# Patient Record
Sex: Female | Born: 1975
Health system: Southern US, Community
[De-identification: ages and names within clinical notes are randomized; demographics above are authoritative.]

## PROBLEM LIST (undated history)

## (undated) DIAGNOSIS — D219 Benign neoplasm of connective and other soft tissue, unspecified: Secondary | ICD-10-CM

## (undated) DIAGNOSIS — I10 Essential (primary) hypertension: Secondary | ICD-10-CM

## (undated) HISTORY — DX: Essential (primary) hypertension: I10

## (undated) HISTORY — DX: Benign neoplasm of connective and other soft tissue, unspecified: D21.9

## (undated) HISTORY — PX: NO PAST SURGERIES: SHX2092

---

## 1998-07-25 ENCOUNTER — Emergency Department (HOSPITAL_COMMUNITY): Admission: EM | Admit: 1998-07-25 | Discharge: 1998-07-25 | Payer: Self-pay | Admitting: Emergency Medicine

## 1998-07-25 ENCOUNTER — Encounter: Payer: Self-pay | Admitting: Emergency Medicine

## 2000-11-20 ENCOUNTER — Encounter: Payer: Self-pay | Admitting: Obstetrics

## 2000-11-20 ENCOUNTER — Inpatient Hospital Stay (HOSPITAL_COMMUNITY): Admission: AD | Admit: 2000-11-20 | Discharge: 2000-11-20 | Payer: Self-pay | Admitting: Obstetrics

## 2002-10-03 ENCOUNTER — Emergency Department (HOSPITAL_COMMUNITY): Admission: AD | Admit: 2002-10-03 | Discharge: 2002-10-03 | Payer: Self-pay | Admitting: Emergency Medicine

## 2017-09-06 ENCOUNTER — Encounter: Payer: Self-pay | Admitting: *Deleted

## 2017-09-27 ENCOUNTER — Encounter: Payer: Self-pay | Admitting: Family Medicine

## 2017-09-27 ENCOUNTER — Encounter: Payer: Self-pay | Admitting: General Practice

## 2017-09-27 NOTE — Progress Notes (Signed)
Patient no showed for appt in office today. Per Dr Kennon Rounds, patient can reschedule on her own

## 2017-09-27 NOTE — Progress Notes (Signed)
Patient did not keep appointment today. She may call to reschedule.  

## 2017-11-08 ENCOUNTER — Encounter: Payer: Self-pay | Admitting: Obstetrics & Gynecology

## 2017-11-08 ENCOUNTER — Ambulatory Visit (INDEPENDENT_AMBULATORY_CARE_PROVIDER_SITE_OTHER): Payer: Self-pay | Admitting: Obstetrics & Gynecology

## 2017-11-08 DIAGNOSIS — D259 Leiomyoma of uterus, unspecified: Secondary | ICD-10-CM | POA: Insufficient documentation

## 2017-11-08 DIAGNOSIS — I10 Essential (primary) hypertension: Secondary | ICD-10-CM

## 2017-11-08 DIAGNOSIS — N921 Excessive and frequent menstruation with irregular cycle: Secondary | ICD-10-CM

## 2017-11-08 NOTE — Patient Instructions (Signed)

## 2017-11-08 NOTE — Progress Notes (Signed)
Patient ID: Claire Phillips, female   DOB: 05-20-1975, 42 y.o.   MRN: [id number]  Chief Complaint  Patient presents with  . Gynecologic Exam  heavy periods and H/o fibroids  HPI Claire Phillips is a 42 y.o. female.  G1P1001 No LMP recorded. Heavy menses with clots and flow for up to 2 weeks with pain. Notes constipation and nausea and decreased appetite. H/O fibroid, referred from [facility]PI  Past Medical History:  Diagnosis Date  . Fibroid   . Hypertension     Past Surgical History:  Procedure Laterality Date  . NO PAST SURGERIES      History reviewed. No pertinent family history.  Social History Social History   Tobacco Use  . Smoking status: Current Every Day Smoker    Packs/day: 0.50    Types: Cigarettes  . Smokeless tobacco: Never Used  Substance Use Topics  . Alcohol use: Yes    Alcohol/week: 5.0 standard drinks    Types: 2 Cans of beer, 3 Glasses of wine per week    Comment: Occasionally  . Drug use: Never    Not on File  No current outpatient medications on file.   No current facility-administered medications for this visit.     Review of Systems Review of Systems  Constitutional: Positive for appetite change.  Respiratory: Negative.   Cardiovascular: Negative.   Gastrointestinal: Positive for abdominal distention, constipation, nausea and vomiting.  Genitourinary: Positive for frequency and menstrual problem. Negative for vaginal bleeding.  Musculoskeletal: Negative.     Blood pressure (!) 152/102, pulse 69, height 5\' 6"  (1.676 m), weight 60.6 kg.  Physical Exam Physical Exam  Constitutional: She appears well-developed. No distress.  Cardiovascular: Normal rate.  Pulmonary/Chest: Effort normal. No respiratory distress.  Abdominal: She exhibits distension and mass. There is no tenderness.  Genitourinary: Vagina normal. No vaginal discharge found.  Genitourinary Comments: Uterus firm 14 week size irregular  Vitals reviewed.   Data  Reviewed Referral notes  Assessment    Sx fibroid uterus GI symptoms  HTN  Plan    Pelvic US CBC and CMP F/u 3 weeks Take HTN med as prescribed       Emeterio Reeve 11/08/2017, 10:03 AM

## 2017-11-09 LAB — COMPREHENSIVE METABOLIC PANEL
ALT: 7 IU/L (ref 0–32)
AST: 13 IU/L (ref 0–40)
Albumin/Globulin Ratio: 1.3 (ref 1.2–2.2)
Albumin: 3.9 g/dL (ref 3.5–5.5)
Alkaline Phosphatase: 60 IU/L (ref 39–117)
BUN/Creatinine Ratio: 9 (ref 9–23)
BUN: 7 mg/dL (ref 6–24)
Bilirubin Total: 0.6 mg/dL (ref 0.0–1.2)
CO2: 22 mmol/L (ref 20–29)
Calcium: 8.7 mg/dL (ref 8.7–10.2)
Chloride: 104 mmol/L (ref 96–106)
Creatinine, Ser: 0.75 mg/dL (ref 0.57–1.00)
GFR calc Af Amer: 113 mL/min/{1.73_m2} (ref >59)
GFR calc non Af Amer: 98 mL/min/{1.73_m2} (ref >59)
Globulin, Total: 3 g/dL (ref 1.5–4.5)
Glucose: 71 mg/dL (ref 65–99)
Potassium: 3.9 mmol/L (ref 3.5–5.2)
Sodium: 135 mmol/L (ref 134–144)
Total Protein: 6.9 g/dL (ref 6.0–8.5)

## 2017-11-09 LAB — CBC
Hematocrit: 26.5 % — ABNORMAL LOW (ref 34.0–46.6)
Hemoglobin: 7.5 g/dL — ABNORMAL LOW (ref 11.1–15.9)
MCH: 18.4 pg — ABNORMAL LOW (ref 26.6–33.0)
MCHC: 28.3 g/dL — ABNORMAL LOW (ref 31.5–35.7)
MCV: 65 fL — ABNORMAL LOW (ref 79–97)
Platelets: 451 10*3/uL — ABNORMAL HIGH (ref 150–450)
RBC: 4.07 x10E6/uL (ref 3.77–5.28)
RDW: 18.9 % — ABNORMAL HIGH (ref 12.3–15.4)
WBC: 4.3 10*3/uL (ref 3.4–10.8)

## 2017-11-11 ENCOUNTER — Telehealth: Payer: Self-pay

## 2017-11-11 NOTE — Telephone Encounter (Signed)
Called pt to advise of [place] appt on 11/18/17 at 11am, needs to arrive @ 10:45.No answer & no VM.

## 2017-11-12 ENCOUNTER — Telehealth: Payer: Self-pay | Admitting: *Deleted

## 2017-11-12 ENCOUNTER — Other Ambulatory Visit: Payer: Self-pay | Admitting: Obstetrics & Gynecology

## 2017-11-12 DIAGNOSIS — N921 Excessive and frequent menstruation with irregular cycle: Secondary | ICD-10-CM

## 2017-11-12 MED ORDER — FERROUS SULFATE 325 (65 FE) MG PO TABS: 325.0000 mg | ORAL_TABLET | Freq: Two times a day (BID) | ORAL | 3 refills | Status: AC

## 2017-11-12 NOTE — Telephone Encounter (Signed)
This encounter created to fix error made in previous letter.

## 2017-11-12 NOTE — Telephone Encounter (Signed)
Called the pt again to advised her of her ultrasound appointment scheduled on 11/18/17 at 11am and pt need to arrive at 1045.  Pt did not pick up and the message "your call can not be completed as dialed" was heard after ringing for several minutes.  Will send a letter.

## 2017-11-13 ENCOUNTER — Telehealth: Payer: Self-pay | Admitting: *Deleted

## 2017-11-13 NOTE — Telephone Encounter (Signed)
Called pt to inform her that she is anemic and has been prescribed iron to take BID.  This prescription was sent to the [facility] in [place] on [address]  Pt did not answer and the message "your call cannot be completed at this time" was received after several minutes of ringing.  Will send letter.

## 2017-11-13 NOTE — Telephone Encounter (Signed)
-----   Message from Woodroe Mode, MD sent at 11/12/2017 10:44 AM EST ----- Iron supplement ordered for anemia

## 2017-11-18 ENCOUNTER — Ambulatory Visit (HOSPITAL_COMMUNITY): Admission: RE | Admit: 2017-11-18 | Payer: Self-pay | Source: Ambulatory Visit

## 2017-12-02 ENCOUNTER — Encounter: Payer: Self-pay | Admitting: Obstetrics & Gynecology

## 2017-12-02 ENCOUNTER — Ambulatory Visit (INDEPENDENT_AMBULATORY_CARE_PROVIDER_SITE_OTHER): Payer: Self-pay | Admitting: Obstetrics & Gynecology

## 2017-12-02 VITALS — BP 149/88 | HR 79 | Ht 66.0 in | Wt 133.9 lb

## 2017-12-02 DIAGNOSIS — R112 Nausea with vomiting, unspecified: Secondary | ICD-10-CM

## 2017-12-02 DIAGNOSIS — D259 Leiomyoma of uterus, unspecified: Secondary | ICD-10-CM

## 2017-12-02 MED ORDER — PROMETHAZINE HCL 25 MG PO TABS: 25.0000 mg | ORAL_TABLET | Freq: Four times a day (QID) | ORAL | 2 refills | Status: DC | PRN

## 2017-12-02 MED ORDER — MEDROXYPROGESTERONE ACETATE 10 MG PO TABS: 20.0000 mg | ORAL_TABLET | Freq: Every day | ORAL | 2 refills | Status: DC

## 2017-12-02 MED ORDER — HYDROCHLOROTHIAZIDE 25 MG PO TABS: 25.0000 mg | ORAL_TABLET | Freq: Every day | ORAL | 1 refills | Status: AC

## 2017-12-02 MED FILL — PROMETHAZINE 25 MG TABLET: 25 | 7 days supply | Qty: 30 | Fill #0

## 2017-12-02 MED FILL — HYDROCHLOROTHIAZIDE 25 MG T: 25 | 30 days supply | Qty: 30 | Fill #0

## 2017-12-02 MED FILL — MEDROXYPROGESTERONE ACETATE: 10 | 15 days supply | Qty: 30 | Fill #0

## 2017-12-02 NOTE — Progress Notes (Signed)
  Subjective:     Patient ID: Claire Phillips, female   DOB: March 01, 1975, 42 y.o.   MRN: [id number]  HPI G2P1001 Patient's last menstrual period was 11/11/2017 (approximate). She still needs her [place].  Past Medical History:  Diagnosis Date  . Fibroid   . Hypertension    Past Surgical History:  Procedure Laterality Date  . NO PAST SURGERIES     Not on File Current Outpatient Medications on File Prior to Visit  Medication Sig Dispense Refill  . ferrous sulfate 325 (65 FE) MG tablet Take 1 tablet (325 mg total) by mouth 2 (two) times daily with a meal. 60 tablet 3   No current facility-administered medications on file prior to visit.      Review of Systems  Gastrointestinal: Positive for nausea and vomiting.  Genitourinary: Positive for menstrual problem.       Objective:   Physical Exam  Constitutional: She appears well-developed. No distress.  Pulmonary/Chest: Effort normal.   Today's Vitals   12/02/17 0848 12/02/17 0849  BP: (!) 143/96 (!) 149/88  Pulse: 76 79  Weight: 133 lb 14.4 oz (60.7 kg)   Height: 5\' 6"  (1.676 m)   PainSc: 0-No pain    Body mass index is 21.61 kg/m.;    CBC    Component Value Date/Time   WBC 4.3 11/08/2017 1112   RBC 4.07 11/08/2017 1112   HGB 7.5 (L) 11/08/2017 1112   HCT 26.5 (L) 11/08/2017 1112   PLT 451 (H) 11/08/2017 1112   MCV 65 (L) 11/08/2017 1112   MCH 18.4 (L) 11/08/2017 1112   MCHC 28.3 (L) 11/08/2017 1112   RDW 18.9 (H) 11/08/2017 1112    Assessment:     Patient Active Problem List   Diagnosis Date Noted  . Nausea & vomiting 12/02/2017  . Fibroid uterus 11/08/2017  . Menometrorrhagia 11/08/2017  . Essential hypertension 11/08/2017  Anemia     Plan:     Financial application, anticipate scheduling [facility] care f/u, needs to be seen by GI for N&V RTC for Bx endometrium Provera 20 mg, Phenergan Rx  Woodroe Mode, MD 12/02/2017

## 2017-12-02 NOTE — Progress Notes (Signed)
Pt given Depo Lupron application and advised to bring the completed application with verification of income back to the office.  At that time we will verify that she has completed everything and fax the application.   Pt stated understanding with no further questions.

## 2017-12-02 NOTE — Patient Instructions (Signed)
Hysterectomy Information A hysterectomy is a surgery to remove your uterus. After surgery, you will no longer have periods. Also, you will not be able to get pregnant. Reasons for this surgery  You have bleeding that is not normal and keeps coming back.  You have lasting (chronic) lower belly (pelvic) pain.  You have a lasting infection.  The lining of your uterus grows outside your uterus.  The lining of your uterus grows in the muscle of your uterus.  Your uterus falls down into your vagina.  You have a growth in your uterus that causes problems.  You have cells that could turn into cancer (precancerous cells).  You have cancer of the uterus or cervix. Types There are 3 types of hysterectomies. Depending on the type, the surgery will:  Remove the top part of the uterus only.  Remove the uterus and the cervix.  Remove the uterus, cervix, and tissue that holds the uterus in place in the lower belly.  Ways a hysterectomy can be performed There are 5 ways this surgery can be performed.  A cut (incision) is made in the belly (abdomen). The uterus is taken out through the cut.  A cut is made in the vagina. The uterus is taken out through the cut.  Three or four cuts are made in the belly. A surgical device with a camera is put through one of the cuts. The uterus is cut into small pieces. The uterus is taken out through the cuts or the vagina.  Three or four cuts are made in the belly. A surgical device with a camera is put through one of the cuts. The uterus is taken out through the vagina.  Three or four cuts are made in the belly. A surgical device that is controlled by a computer makes a visual image. The device helps the surgeon control the surgical tools. The uterus is cut into small pieces. The pieces are taken out through the cuts or through the vagina.  What can I expect after the surgery?  You will be given pain medicine.  You will need help at home for 3-5 days  after surgery.  You will need to see your doctor in 2-4 weeks after surgery.  You may get hot flashes, have night sweats, and have trouble sleeping.  You may need to have Pap tests in the future if your surgery was related to cancer. Talk to your doctor. It is still good to have regular exams. This information is not intended to replace advice given to you by your health care provider. Make sure you discuss any questions you have with your health care provider. Document Released: 03/12/2011 Document Revised: 05/26/2015 Document Reviewed: 08/25/2012 Elsevier Interactive Patient Education  [employer].

## 2017-12-05 ENCOUNTER — Ambulatory Visit (HOSPITAL_COMMUNITY)
Admission: RE | Admit: 2017-12-05 | Discharge: 2017-12-05 | Disposition: A | Discharge status: Home / Self Care | Payer: Self-pay | Source: Ambulatory Visit | Attending: Obstetrics & Gynecology | Admitting: Obstetrics & Gynecology

## 2017-12-05 DIAGNOSIS — D259 Leiomyoma of uterus, unspecified: Secondary | ICD-10-CM | POA: Insufficient documentation

## 2017-12-09 ENCOUNTER — Telehealth: Payer: Self-pay | Admitting: *Deleted

## 2017-12-09 NOTE — Telephone Encounter (Signed)
-----   Message from Woodroe Mode, MD sent at 12/06/2017 11:45 AM EST ----- Fibroid uterus, please keep appointment

## 2017-12-09 NOTE — Telephone Encounter (Signed)
Called pt to inform her that her ultrasound showed fibroids and to reinforce the importance of keeping her follow up appointment.  Pt did not pick up and after several minutes of ringing, the message "Marya Amsler sorry but your call cannot be completed as dialed" was received.  She has a follow up appointment on 01/02/18 @ 1535.

## 2017-12-10 NOTE — Telephone Encounter (Signed)
Second attempt to call pt to notify her of her ultrasound results.  Pt did not pick up and after several minutes of ringing, the message "Marya Amsler sorry but your call cannot be completed as dialed" was received.  Will send letter.

## 2018-01-02 ENCOUNTER — Encounter: Payer: Self-pay | Admitting: Obstetrics & Gynecology

## 2018-01-02 ENCOUNTER — Emergency Department (HOSPITAL_COMMUNITY): Payer: Self-pay

## 2018-01-02 ENCOUNTER — Emergency Department (HOSPITAL_COMMUNITY)
Admission: EM | Admit: 2018-01-02 | Discharge: 2018-01-02 | Disposition: A | Discharge status: Home / Self Care | Payer: Self-pay | Attending: Emergency Medicine | Admitting: Emergency Medicine

## 2018-01-02 ENCOUNTER — Encounter (HOSPITAL_COMMUNITY): Payer: Self-pay | Admitting: Emergency Medicine

## 2018-01-02 ENCOUNTER — Other Ambulatory Visit: Payer: Self-pay

## 2018-01-02 ENCOUNTER — Ambulatory Visit (INDEPENDENT_AMBULATORY_CARE_PROVIDER_SITE_OTHER): Payer: Self-pay | Admitting: Obstetrics & Gynecology

## 2018-01-02 VITALS — BP 149/108 | HR 79 | Ht 66.0 in | Wt 128.6 lb

## 2018-01-02 DIAGNOSIS — I10 Essential (primary) hypertension: Secondary | ICD-10-CM | POA: Insufficient documentation

## 2018-01-02 DIAGNOSIS — F17210 Nicotine dependence, cigarettes, uncomplicated: Secondary | ICD-10-CM | POA: Insufficient documentation

## 2018-01-02 DIAGNOSIS — R109 Unspecified abdominal pain: Secondary | ICD-10-CM | POA: Insufficient documentation

## 2018-01-02 DIAGNOSIS — D259 Leiomyoma of uterus, unspecified: Secondary | ICD-10-CM

## 2018-01-02 DIAGNOSIS — N390 Urinary tract infection, site not specified: Secondary | ICD-10-CM | POA: Insufficient documentation

## 2018-01-02 DIAGNOSIS — Z79899 Other long term (current) drug therapy: Secondary | ICD-10-CM | POA: Insufficient documentation

## 2018-01-02 LAB — CBC
HCT: 27.6 % — ABNORMAL LOW (ref 36.0–46.0)
Hemoglobin: 8 g/dL — ABNORMAL LOW (ref 12.0–15.0)
MCH: 19 pg — AB (ref 26.0–34.0)
MCHC: 29 g/dL — ABNORMAL LOW (ref 30.0–36.0)
MCV: 65.4 fL — AB (ref 80.0–100.0)
Platelets: 418 10*3/uL — ABNORMAL HIGH (ref 150–400)
RBC: 4.22 MIL/uL (ref 3.87–5.11)
RDW: 21.8 % — ABNORMAL HIGH (ref 11.5–15.5)
WBC: 7.4 10*3/uL (ref 4.0–10.5)
nRBC: 0 % (ref 0.0–0.2)

## 2018-01-02 LAB — URINALYSIS, ROUTINE W REFLEX MICROSCOPIC
Bilirubin Urine: NEGATIVE
Glucose, UA: NEGATIVE mg/dL
Hgb urine dipstick: NEGATIVE
Ketones, ur: 80 mg/dL — AB
Leukocytes, UA: NEGATIVE
Nitrite: NEGATIVE
Protein, ur: NEGATIVE mg/dL
Specific Gravity, Urine: 1.039 — ABNORMAL HIGH (ref 1.005–1.030)
pH: 7 (ref 5.0–8.0)

## 2018-01-02 LAB — COMPREHENSIVE METABOLIC PANEL
ALBUMIN: 3.2 g/dL — AB (ref 3.5–5.0)
ALT: 14 U/L (ref 0–44)
ANION GAP: 12 (ref 5–15)
AST: 16 U/L (ref 15–41)
Alkaline Phosphatase: 62 U/L (ref 38–126)
BUN: <5 mg/dL — ABNORMAL LOW (ref 6–20)
CALCIUM: 8.8 mg/dL — AB (ref 8.9–10.3)
CO2: 20 mmol/L — AB (ref 22–32)
Chloride: 103 mmol/L (ref 98–111)
Creatinine, Ser: 0.71 mg/dL (ref 0.44–1.00)
GFR calc Af Amer: >60 mL/min (ref >60)
GFR calc non Af Amer: >60 mL/min (ref >60)
Glucose, Bld: 107 mg/dL — ABNORMAL HIGH (ref 70–99)
Potassium: 2.8 mmol/L — ABNORMAL LOW (ref 3.5–5.1)
SODIUM: 135 mmol/L (ref 135–145)
TOTAL PROTEIN: 7 g/dL (ref 6.5–8.1)
Total Bilirubin: 0.7 mg/dL (ref 0.3–1.2)

## 2018-01-02 LAB — I-STAT BETA HCG BLOOD, ED (MC, WL, AP ONLY): I-stat hCG, quantitative: 11.4 m[IU]/mL — ABNORMAL HIGH (ref <5)

## 2018-01-02 LAB — HCG, QUANTITATIVE, PREGNANCY: hCG, Beta Chain, Quant, S: <1 m[IU]/mL (ref <5)

## 2018-01-02 LAB — LIPASE, BLOOD: Lipase: 16 U/L (ref 11–51)

## 2018-01-02 MED ORDER — PROMETHAZINE HCL 25 MG PO TABS: 25.0000 mg | ORAL_TABLET | Freq: Four times a day (QID) | ORAL | 0 refills | Status: DC | PRN

## 2018-01-02 MED ORDER — CEPHALEXIN 500 MG PO CAPS: 500.0000 mg | ORAL_CAPSULE | Freq: Two times a day (BID) | ORAL | 0 refills | Status: AC

## 2018-01-02 MED ORDER — ONDANSETRON HCL 4 MG/2ML IJ SOLN
4.0000 mg | Freq: Once | INTRAMUSCULAR | Status: AC | PRN
  Administered 2018-01-02: 4 mg via INTRAVENOUS
  Filled 2018-01-02: qty 2

## 2018-01-02 MED ORDER — SODIUM CHLORIDE 0.9 % IV SOLN
1.0000 g | Freq: Once | INTRAVENOUS | Status: AC
  Administered 2018-01-02: 1 g via INTRAVENOUS
  Filled 2018-01-02: qty 10

## 2018-01-02 MED ORDER — IOHEXOL 300 MG/ML  SOLN
100.0000 mL | Freq: Once | INTRAMUSCULAR | Status: AC | PRN
  Administered 2018-01-02: 100 mL via INTRAVENOUS

## 2018-01-02 MED ORDER — MORPHINE SULFATE (PF) 4 MG/ML IV SOLN
4.0000 mg | Freq: Once | INTRAVENOUS | Status: AC
  Administered 2018-01-02: 4 mg via INTRAVENOUS
  Filled 2018-01-02: qty 1

## 2018-01-02 MED ORDER — IBUPROFEN 800 MG PO TABS: 800.0000 mg | ORAL_TABLET | Freq: Four times a day (QID) | ORAL | 0 refills | Status: AC | PRN

## 2018-01-02 MED ORDER — SODIUM CHLORIDE FLUSH 0.9 % IV SOLN
5.00 | INTRAVENOUS | Status: DC
Start: ? — End: 2018-01-02

## 2018-01-02 MED ORDER — SODIUM CHLORIDE 0.9 % IV BOLUS
1000.0000 mL | Freq: Once | INTRAVENOUS | Status: AC
  Administered 2018-01-02: 1000 mL via INTRAVENOUS

## 2018-01-02 MED ORDER — METOCLOPRAMIDE HCL 5 MG/ML IJ SOLN
10.0000 mg | Freq: Once | INTRAMUSCULAR | Status: AC
  Administered 2018-01-02: 10 mg via INTRAVENOUS
  Filled 2018-01-02: qty 2

## 2018-01-02 NOTE — Progress Notes (Signed)
Patient ID: Claire Phillips, female   DOB: 15-Feb-1975, 43 y.o.   MRN: [id number]  Chief Complaint  Patient presents with  . Follow-up    HPI Claire Phillips is a 43 y.o. female.  G1P1001 Patient's last menstrual period was 11/28/2017 (within days). She was seen in ED today and Dx with UTI possible pyelonephritis. She notes pain and N&V. Still needs Lupron and financial aid for surgery, [facility] HPI  Past Medical History:  Diagnosis Date  . Fibroid   . Hypertension     Past Surgical History:  Procedure Laterality Date  . NO PAST SURGERIES      History reviewed. No pertinent family history.  Social History Social History   Tobacco Use  . Smoking status: Current Every Day Smoker    Packs/day: 0.50    Types: Cigarettes  . Smokeless tobacco: Never Used  Substance Use Topics  . Alcohol use: Yes    Alcohol/week: 5.0 standard drinks    Types: 2 Cans of beer, 3 Glasses of wine per week    Comment: Occasionally  . Drug use: Never    No Known Allergies  Current Outpatient Medications  Medication Sig Dispense Refill  . ferrous sulfate 325 (65 FE) MG tablet Take 1 tablet (325 mg total) by mouth 2 (two) times daily with a meal. 60 tablet 3  . hydrochlorothiazide (HYDRODIURIL) 25 MG tablet Take 1 tablet (25 mg total) by mouth daily. 30 tablet 1  . cephALEXin (KEFLEX) 500 MG capsule Take 1 capsule (500 mg total) by mouth 2 (two) times daily. (Patient not taking: Reported on 01/02/2018) 14 capsule 0  . ibuprofen (ADVIL,MOTRIN) 800 MG tablet Take 1 tablet (800 mg total) by mouth every 6 (six) hours as needed for moderate pain. (Patient not taking: Reported on 01/02/2018) 20 tablet 0  . medroxyPROGESTERone (PROVERA) 10 MG tablet Take 2 tablets (20 mg total) by mouth daily. (Patient not taking: Reported on 01/02/2018) 30 tablet 2  . promethazine (PHENERGAN) 25 MG tablet Take 1 tablet (25 mg total) by mouth every 6 (six) hours as needed for nausea or vomiting. (Patient not taking: Reported on  01/02/2018) 30 tablet 0   No current facility-administered medications for this visit.     Review of Systems Review of Systems  Constitutional: Positive for fever.  Gastrointestinal: Positive for nausea and vomiting.  Genitourinary: Positive for flank pain.    Blood pressure (!) 149/108, pulse 79, height 5\' 6"  (1.676 m), weight 128 lb 9.6 oz (58.3 kg), last menstrual period 11/28/2017, unknown if currently breastfeeding.  Physical Exam Physical Exam Constitutional:      Appearance: She is ill-appearing.  Pulmonary:     Effort: Pulmonary effort is normal.  Neurological:     Mental Status: She is alert.     Data Reviewed CLINICAL DATA:  Fibroids.  EXAM: TRANSABDOMINAL AND TRANSVAGINAL ULTRASOUND OF PELVIS  TECHNIQUE: Both transabdominal and transvaginal ultrasound examinations of the pelvis were performed. Transabdominal technique was performed for global imaging of the pelvis including uterus, ovaries, adnexal regions, and pelvic cul-de-sac. It was necessary to proceed with endovaginal exam following the transabdominal exam to visualize the endometrium, uterus, ovaries.  COMPARISON:  CT of the abdomen and pelvis on 11/12/2015  FINDINGS: Uterus  Measurements: At least 12.9 x 8.1 x 10.3 centimeter = volume: 566.7 mL. No fibroids or other mass visualized. Multiple fibroids are present, obscuring portions of the uterus/endometrium. Largest fibroids measure 6.3, 5.2, and 5.2 centimeters.  Endometrium  Thickness: 9.5 millimeters, displaced by  multiple fibroids. No focal abnormality visualized.  Right ovary  Measurements: 3.4 x 2.6 x 2.2 centimeters = volume: 10.0 mL. Normal appearance/no adnexal mass.  Left ovary  Measurements: 2.5 x 1.8 x 2.0 centimeters = volume: 4.5 mL. Normal appearance/no adnexal mass.  Other findings  No abnormal free fluid.  IMPRESSION: 1. Multiple fibroids enlarging the uterus. 2. Normal appearance of the endometrial  stripe. 3. No adnexal mass.   Electronically Signed   By: Nolon Nations M.D.   On: 12/05/2017 15:14  Assessment    Fibroid uterus Tx for UTI     Plan    Financial aid application Lupron 36.46 mg IM RTC6 weeks for [facility] and schedule surgery Complete course of antibiotic       Emeterio Reeve 01/02/2018, 4:18 PM

## 2018-01-02 NOTE — Patient Instructions (Signed)
Uterine Fibroids    Uterine fibroids are lumps of tissue (tumors) in your womb (uterus). They are not cancer (are benign).  Most women with this condition do not need treatment. Sometimes fibroids can affect your ability to have children (your fertility). If that happens, you may need surgery to take out the fibroids.  Follow these instructions at home:  · Take over-the-counter and prescription medicines only as told by your doctor. Your doctor may suggest NSAIDs (such as aspirin or ibuprofen) to help with pain.  · Ask your doctor if you should:  ? Take iron pills.  ? Eat more foods that have iron in them, such as dark green, leafy vegetables.  · If directed, apply heat to your back or belly to reduce pain. Use the heat source that your doctor recommends, such as a moist heat pack or a heating pad.  ? Put a towel between your skin and the heat source.  ? Leave the heat on for 20-30 minutes.  ? Remove the heat if your skin turns bright red. This is especially important if you are unable to feel pain, heat, or cold. You may have a greater risk of getting burned.  · Pay close attention to your period (menstrual) cycles. Tell your doctor about any changes, such as:  ? A heavier blood flow than usual.  ? Needing to use more pads or tampons than normal.  ? A change in how many days your period lasts.  ? A change in symptoms that come with your period, such as cramps or back pain.  · Keep all follow-up visits as told by your doctor. This is important. Your doctor may need to watch your fibroids over time for any changes.  Contact a doctor if you:  · Have pain that does not get better with medicine or heat, such as pain or cramps in:  ? Your back.  ? The area between your hip bones (pelvic area).  ? Your belly.  · Have new bleeding between your periods.  · Have more bleeding during or between your periods.  · Feel very tired or weak.  · Feel light-headed.  Get help right away if you:  · Pass out (faint).  · Have pain in the  area between your hip bones that suddenly gets worse.  · Have bleeding that soaks a tampon or pad in 30 minutes or less.  Summary  · Uterine fibroids are lumps of tissue (tumors) in your womb (uterus). They are not cancer.  · The only treatment that most women need is taking aspirin or ibuprofen for pain.  · Contact a doctor if you have pain or cramps that do not get better with medicine.  · Make sure you know what symptoms you should get help for right away.  This information is not intended to replace advice given to you by your health care provider. Make sure you discuss any questions you have with your health care provider.  Document Released: 01/20/2010 Document Revised: 11/13/2016 Document Reviewed: 11/13/2016  Elsevier Interactive Patient Education © 2019 Elsevier Inc.

## 2018-01-02 NOTE — ED Notes (Signed)
Patient is aware we need a Urinalysis, She said that she just got medicine and started feeling better, and not ready to get up rt. Now to go to the Bathroom, give her a few mins.

## 2018-01-02 NOTE — ED Provider Notes (Signed)
[facility] EMERGENCY DEPARTMENT Provider Note   CSN: [id number] Arrival date & time: 01/02/18  0134     History   Chief Complaint Chief Complaint  Patient presents with  . Emesis  . Abdominal Pain    HPI Claire Phillips is a 43 y.o. female.  Patient presents to the emergency department for evaluation of nausea, vomiting, abdominal pain.  Patient reports that she has been experiencing     Past Medical History:  Diagnosis Date  . Fibroid   . Hypertension     Patient Active Problem List   Diagnosis Date Noted  . Nausea & vomiting 12/02/2017  . Fibroid uterus 11/08/2017  . Menometrorrhagia 11/08/2017  . Essential hypertension 11/08/2017    Past Surgical History:  Procedure Laterality Date  . NO PAST SURGERIES       OB History    Gravida  2   Para  1   Term  1   Preterm      AB      Living  1     SAB      TAB      Ectopic      Multiple      Live Births  1            Home Medications    Prior to Admission medications   Medication Sig Start Date End Date Taking? Authorizing Provider  ferrous sulfate 325 (65 FE) MG tablet Take 1 tablet (325 mg total) by mouth 2 (two) times daily with a meal. 11/12/17  Yes Woodroe Mode, MD  hydrochlorothiazide (HYDRODIURIL) 25 MG tablet Take 1 tablet (25 mg total) by mouth daily. 12/02/17  Yes Woodroe Mode, MD  cephALEXin (KEFLEX) 500 MG capsule Take 1 capsule (500 mg total) by mouth 2 (two) times daily. 01/02/18   Orpah Greek, MD  ibuprofen (ADVIL,MOTRIN) 800 MG tablet Take 1 tablet (800 mg total) by mouth every 6 (six) hours as needed for moderate pain. 01/02/18   Orpah Greek, MD  medroxyPROGESTERone (PROVERA) 10 MG tablet Take 2 tablets (20 mg total) by mouth daily. Patient not taking: Reported on 01/02/2018 12/02/17   Woodroe Mode, MD  promethazine (PHENERGAN) 25 MG tablet Take 1 tablet (25 mg total) by mouth every 6 (six) hours as needed for nausea or vomiting.  01/02/18   Orpah Greek, MD    Family History No family history on file.  Social History Social History   Tobacco Use  . Smoking status: Current Every Day Smoker    Packs/day: 0.50    Types: Cigarettes  . Smokeless tobacco: Never Used  Substance Use Topics  . Alcohol use: Yes    Alcohol/week: 5.0 standard drinks    Types: 2 Cans of beer, 3 Glasses of wine per week    Comment: Occasionally  . Drug use: Never     Allergies   Patient has no known allergies.   Review of Systems Review of Systems   Physical Exam Updated Vital Signs BP (!) 147/94   Pulse 79   Temp 99.1 F (37.3 C) (Oral)   Resp 20   Ht 5\' 6"  (1.676 m)   Wt 59 kg   LMP 11/11/2017 (Exact Date)   SpO2 97%   Breastfeeding Unknown   BMI 20.98 kg/m   Physical Exam   ED Treatments / Results  Labs (all labs ordered are listed, but only abnormal results are displayed) Labs Reviewed  COMPREHENSIVE METABOLIC PANEL -  Abnormal; Notable for the following components:      Result Value   Potassium 2.8 (*)    CO2 20 (*)    Glucose, Bld 107 (*)    BUN <5 (*)    Calcium 8.8 (*)    Albumin 3.2 (*)    All other components within normal limits  CBC - Abnormal; Notable for the following components:   Hemoglobin 8.0 (*)    HCT 27.6 (*)    MCV 65.4 (*)    MCH 19.0 (*)    MCHC 29.0 (*)    RDW 21.8 (*)    Platelets 418 (*)    All other components within normal limits  URINALYSIS, ROUTINE W REFLEX MICROSCOPIC - Abnormal; Notable for the following components:   Specific Gravity, Urine 1.039 (*)    Ketones, ur 80 (*)    All other components within normal limits  I-STAT BETA HCG BLOOD, ED ([facility], [facility], [facility] ONLY) - Abnormal; Notable for the following components:   I-stat hCG, quantitative 11.4 (*)    All other components within normal limits  URINE CULTURE  LIPASE, BLOOD  HCG, QUANTITATIVE, PREGNANCY    EKG None  Radiology Ct Abdomen Pelvis W Contrast  Result Date: 01/02/2018 CLINICAL DATA:   Abdominal pain, nausea and vomiting. EXAM: CT ABDOMEN AND PELVIS WITH CONTRAST TECHNIQUE: Multidetector CT imaging of the abdomen and pelvis was performed using the standard protocol following bolus administration of intravenous contrast. CONTRAST:  110mL OMNIPAQUE IOHEXOL 300 MG/ML  SOLN COMPARISON:  CT abdomen pelvis 11/12/2015 FINDINGS: LOWER CHEST: There is no basilar pleural or apical pericardial effusion. HEPATOBILIARY: Numerous hepatic cysts, the largest of which is located in the left hepatic lobe and measures 3.4 cm. The gallbladder is normal. PANCREAS: The pancreatic parenchymal contours are normal and there is no ductal dilatation. There is no peripancreatic fluid collection. SPLEEN: Normal. ADRENALS/URINARY TRACT: --Adrenal glands: Normal. --Kidneys: There are areas of hypoattenuation at the lower poles of both kidneys. No hydronephrosis. --Urinary bladder: Normal for degree of distention STOMACH/BOWEL: --Stomach/Duodenum: There is no hiatal hernia or other gastric abnormality. The duodenal course and caliber are normal. --Small bowel: No dilatation or inflammation. --Colon: No focal abnormality. --Appendix: Normal. VASCULAR/LYMPHATIC: Normal course and caliber of the major abdominal vessels. No abdominal or pelvic lymphadenopathy. REPRODUCTIVE: Unchanged appearance of fibroid uterus. Left ovarian cysts measure up to 2.8 cm. Unchanged appearance of right ovary. MUSCULOSKELETAL. No bony spinal canal stenosis or focal osseous abnormality. OTHER: None. IMPRESSION: 1. Areas of hypoattenuation at the lower poles of both kidneys could indicate pyelonephritis. Correlate with urinalysis results. 2. Unchanged appearance of fibroid uterus and left ovarian cysts. Electronically Signed   By: Ulyses Jarred M.D.   On: 01/02/2018 06:09    Procedures Procedures (including critical care time)  Medications Ordered in ED Medications  ondansetron (ZOFRAN) injection 4 mg (4 mg Intravenous Given 01/02/18 0326)  sodium  chloride 0.9 % bolus 1,000 mL (0 mLs Intravenous Stopped 01/02/18 0500)  iohexol (OMNIPAQUE) 300 MG/ML solution 100 mL (100 mLs Intravenous Contrast Given 01/02/18 0542)  morphine 4 MG/ML injection 4 mg (4 mg Intravenous Given 01/02/18 0604)  metoCLOPramide (REGLAN) injection 10 mg (10 mg Intravenous Given 01/02/18 0604)  cefTRIAXone (ROCEPHIN) 1 g in sodium chloride 0.9 % 100 mL IVPB (1 g Intravenous New Bag/Given 01/02/18 0714)     Initial Impression / Assessment and Plan / ED Course  I have reviewed the triage vital signs and the nursing notes.  Pertinent labs & imaging results that  were available during my care of the patient were reviewed by me and considered in my medical decision making (see chart for details).     Patient presents to the emergency department with complaints of abdominal pain with nausea and vomiting.  Patient reports that she was recently diagnosed with urinary tract infection, given Ultram for pain and started on Bactrim.  After initiating the medication she started having the pain and nausea and vomiting, has not taken any meds in a day.  Patient's work-up is unrevealing.  Lab work did not show any significant abnormality.  Urinalysis is equivocal.  CT scan concerning for possible [place].  We will therefore empirically treat.  Patient given IV Rocephin here in the ER and will switch to Keflex to see if it is more tolerable.  Urine culture has been sent.  Final Clinical Impressions(s) / ED Diagnoses   Final diagnoses:  Abdominal pain, unspecified abdominal location  Urinary tract infection without hematuria, site unspecified    ED Discharge Orders         Ordered    promethazine (PHENERGAN) 25 MG tablet  Every 6 hours PRN     01/02/18 0755    ibuprofen (ADVIL,MOTRIN) 800 MG tablet  Every 6 hours PRN     01/02/18 0755    cephALEXin (KEFLEX) 500 MG capsule  2 times daily     01/02/18 0755           Orpah Greek, MD 01/02/18 [phone]

## 2018-01-02 NOTE — ED Triage Notes (Signed)
Pt BIB [facility] for abd pains, nausea, and vomiting x1 week. EMS reports pt was just recently diagnosed with a UTI on Monday of this week and given bactim & tramadol. EMS reports pt is schedule to have a hysterectomy and thyroidectomy in the morning at this facility. EMS reports 4 episodes of emesis in the last 24 hours. EMS reports 4mg  of zofran that helped relieve the nausea and vomiting. EMS reports 18g IV in left A/C.   Pt reports she was just seen last night at [facility] and diagnosed with a UTI. Pt reports she could not take the medicine because it made her sick.Pt reports generalized abd pain.

## 2018-01-03 LAB — URINE CULTURE: CULTURE: NO GROWTH

## 2018-01-06 ENCOUNTER — Telehealth: Payer: Self-pay | Admitting: General Practice

## 2018-01-06 NOTE — Telephone Encounter (Signed)
Patient called and left message on nurse voicemail line stating she needs to talk to Dr Roselie Awkward about something from her appt last week. Patient didn't leave phone number and it sounded like she was calling from a friends phone. Attempted to call patient back through her listed number, but no answer- unable to leave message as "call could not be completed".

## 2018-02-13 ENCOUNTER — Ambulatory Visit (INDEPENDENT_AMBULATORY_CARE_PROVIDER_SITE_OTHER): Payer: Self-pay | Admitting: Obstetrics & Gynecology

## 2018-02-13 ENCOUNTER — Encounter: Payer: Self-pay | Admitting: Obstetrics & Gynecology

## 2018-02-13 DIAGNOSIS — D259 Leiomyoma of uterus, unspecified: Secondary | ICD-10-CM

## 2018-02-13 MED ORDER — MEDROXYPROGESTERONE ACETATE 10 MG PO TABS: 20.0000 mg | ORAL_TABLET | Freq: Every day | ORAL | 2 refills | Status: AC

## 2018-02-13 MED ORDER — LEUPROLIDE ACETATE (3 MONTH) 11.25 MG IM KIT: 11.2500 mg | PACK | Freq: Once | INTRAMUSCULAR | Status: AC

## 2018-02-13 NOTE — Patient Instructions (Signed)
Uterine Fibroids    Uterine fibroids are lumps of tissue (tumors) in your womb (uterus). They are not cancer (are benign).  Most women with this condition do not need treatment. Sometimes fibroids can affect your ability to have children (your fertility). If that happens, you may need surgery to take out the fibroids.  Follow these instructions at home:  · Take over-the-counter and prescription medicines only as told by your doctor. Your doctor may suggest NSAIDs (such as aspirin or ibuprofen) to help with pain.  · Ask your doctor if you should:  ? Take iron pills.  ? Eat more foods that have iron in them, such as dark green, leafy vegetables.  · If directed, apply heat to your back or belly to reduce pain. Use the heat source that your doctor recommends, such as a moist heat pack or a heating pad.  ? Put a towel between your skin and the heat source.  ? Leave the heat on for 20-30 minutes.  ? Remove the heat if your skin turns bright red. This is especially important if you are unable to feel pain, heat, or cold. You may have a greater risk of getting burned.  · Pay close attention to your period (menstrual) cycles. Tell your doctor about any changes, such as:  ? A heavier blood flow than usual.  ? Needing to use more pads or tampons than normal.  ? A change in how many days your period lasts.  ? A change in symptoms that come with your period, such as cramps or back pain.  · Keep all follow-up visits as told by your doctor. This is important. Your doctor may need to watch your fibroids over time for any changes.  Contact a doctor if you:  · Have pain that does not get better with medicine or heat, such as pain or cramps in:  ? Your back.  ? The area between your hip bones (pelvic area).  ? Your belly.  · Have new bleeding between your periods.  · Have more bleeding during or between your periods.  · Feel very tired or weak.  · Feel light-headed.  Get help right away if you:  · Pass out (faint).  · Have pain in the  area between your hip bones that suddenly gets worse.  · Have bleeding that soaks a tampon or pad in 30 minutes or less.  Summary  · Uterine fibroids are lumps of tissue (tumors) in your womb (uterus). They are not cancer.  · The only treatment that most women need is taking aspirin or ibuprofen for pain.  · Contact a doctor if you have pain or cramps that do not get better with medicine.  · Make sure you know what symptoms you should get help for right away.  This information is not intended to replace advice given to you by your health care provider. Make sure you discuss any questions you have with your health care provider.  Document Released: 01/20/2010 Document Revised: 11/13/2016 Document Reviewed: 11/13/2016  Elsevier Interactive Patient Education © 2019 Elsevier Inc.

## 2018-02-13 NOTE — Progress Notes (Signed)
Patient ID: Claire Phillips, female   DOB: 08-04-1975, 43 y.o.   MRN: [id number]  Chief Complaint  Patient presents with  . Fibroids    HPI Claire Phillips is a 43 y.o. female.  G1P1001 Patient's last menstrual period was 02/07/2018 (exact date). She returns today for f/u of uterine fibroids and menorrhagia. Her financial application is pending and the paperwork for Lupron was not completed.  HPI  Past Medical History:  Diagnosis Date  . Fibroid   . Hypertension     Past Surgical History:  Procedure Laterality Date  . NO PAST SURGERIES      No family history on file.  Social History Social History   Tobacco Use  . Smoking status: Current Every Day Smoker    Packs/day: 0.50    Types: Cigarettes  . Smokeless tobacco: Never Used  Substance Use Topics  . Alcohol use: Yes    Alcohol/week: 5.0 standard drinks    Types: 2 Cans of beer, 3 Glasses of wine per week    Comment: Occasionally  . Drug use: Never    No Known Allergies  Current Outpatient Medications  Medication Sig Dispense Refill  . acetaminophen (TYLENOL) 325 MG tablet Take 650 mg by mouth every 6 (six) hours as needed.    . ferrous sulfate 325 (65 FE) MG tablet Take 1 tablet (325 mg total) by mouth 2 (two) times daily with a meal. 60 tablet 3  . cephALEXin (KEFLEX) 500 MG capsule Take 1 capsule (500 mg total) by mouth 2 (two) times daily. (Patient not taking: Reported on 01/02/2018) 14 capsule 0  . hydrochlorothiazide (HYDRODIURIL) 25 MG tablet Take 1 tablet (25 mg total) by mouth daily. (Patient not taking: Reported on 02/13/2018) 30 tablet 1  . ibuprofen (ADVIL,MOTRIN) 800 MG tablet Take 1 tablet (800 mg total) by mouth every 6 (six) hours as needed for moderate pain. (Patient not taking: Reported on 01/02/2018) 20 tablet 0  . medroxyPROGESTERone (PROVERA) 10 MG tablet Take 2 tablets (20 mg total) by mouth daily. 30 tablet 2  . promethazine (PHENERGAN) 25 MG tablet Take 1 tablet (25 mg total) by mouth every 6  (six) hours as needed for nausea or vomiting. (Patient not taking: Reported on 01/02/2018) 30 tablet 0   Current Facility-Administered Medications  Medication Dose Route Frequency Provider Last Rate Last Dose  . leuprolide (LUPRON) injection 11.25 mg  11.25 mg Intramuscular Once Woodroe Mode, MD        Review of Systems Review of Systems  Constitutional: Negative.   Gastrointestinal: Negative.   Genitourinary: Positive for menstrual problem and vaginal bleeding (spotting). Negative for vaginal discharge.    Blood pressure (!) 150/100, pulse 72, weight 133 lb 12.8 oz (60.7 kg), last menstrual period 02/07/2018, unknown if currently breastfeeding.  Physical Exam Physical Exam Vitals signs and nursing note reviewed.  Constitutional:      Appearance: Normal appearance.  Cardiovascular:     Rate and Rhythm: Normal rate.  Pulmonary:     Effort: Pulmonary effort is normal.  Neurological:     Mental Status: She is alert.  Psychiatric:        Mood and Affect: Mood normal.        Behavior: Behavior normal.     Data Reviewed CBC    Component Value Date/Time   WBC 7.4 01/02/2018 0207   RBC 4.22 01/02/2018 0207   HGB 8.0 (L) 01/02/2018 0207   HGB 7.5 (L) 11/08/2017 1112   HCT 27.6 (L)  01/02/2018 0207   HCT 26.5 (L) 11/08/2017 1112   PLT 418 (H) 01/02/2018 0207   PLT 451 (H) 11/08/2017 1112   MCV 65.4 (L) 01/02/2018 0207   MCV 65 (L) 11/08/2017 1112   MCH 19.0 (L) 01/02/2018 0207   MCHC 29.0 (L) 01/02/2018 0207   RDW 21.8 (H) 01/02/2018 0207   RDW 18.9 (H) 11/08/2017 1112     Assessment    Patient Active Problem List   Diagnosis Date Noted  . Nausea & vomiting 12/02/2017  . Fibroid uterus 11/08/2017  . Menometrorrhagia 11/08/2017  . Essential hypertension 11/08/2017   Anemia, taking iron     Plan    Once financial application is approved, schedule hysterectomy and perform endometrial biopsy. Provera 20 mg daily was reordered. Lupron injection when available.        Emeterio Reeve 02/13/2018, 11:55 AM

## 2018-03-13 ENCOUNTER — Encounter: Payer: Self-pay | Admitting: Obstetrics & Gynecology

## 2018-03-13 ENCOUNTER — Other Ambulatory Visit: Payer: Self-pay

## 2018-03-13 ENCOUNTER — Ambulatory Visit (INDEPENDENT_AMBULATORY_CARE_PROVIDER_SITE_OTHER): Payer: Self-pay | Admitting: Obstetrics & Gynecology

## 2018-03-13 VITALS — BP 134/96 | HR 71 | Wt 140.6 lb

## 2018-03-13 DIAGNOSIS — D259 Leiomyoma of uterus, unspecified: Secondary | ICD-10-CM

## 2018-03-13 DIAGNOSIS — N921 Excessive and frequent menstruation with irregular cycle: Secondary | ICD-10-CM

## 2018-03-13 NOTE — Progress Notes (Signed)
Patient presents with  . Fibroids    HPI Claire Phillips is a 43 y.o. female.  G1P1001 Patient's last menstrual period was 02/07/2018 (exact date). She returns today for f/u of uterine fibroids and menorrhagia. Her financial application is pending and the paperwork for Lupron was rejected and needs to be resubmitted. She has daily bleeding despite Provera daily.  HPI      Past Medical History:  Diagnosis Date  . Fibroid   . Hypertension          Past Surgical History:  Procedure Laterality Date  . NO PAST SURGERIES      No family history on file.  Social History Social History        Tobacco Use  . Smoking status: Current Every Day Smoker    Packs/day: 0.50    Types: Cigarettes  . Smokeless tobacco: Never Used  Substance Use Topics  . Alcohol use: Yes    Alcohol/week: 5.0 standard drinks    Types: 2 Cans of beer, 3 Glasses of wine per week    Comment: Occasionally  . Drug use: Never    No Known Allergies        Current Outpatient Medications  Medication Sig Dispense Refill  . acetaminophen (TYLENOL) 325 MG tablet Take 650 mg by mouth every 6 (six) hours as needed.    . ferrous sulfate 325 (65 FE) MG tablet Take 1 tablet (325 mg total) by mouth 2 (two) times daily with a meal. 60 tablet 3  . cephALEXin (KEFLEX) 500 MG capsule Take 1 capsule (500 mg total) by mouth 2 (two) times daily. (Patient not taking: Reported on 01/02/2018) 14 capsule 0  . hydrochlorothiazide (HYDRODIURIL) 25 MG tablet Take 1 tablet (25 mg total) by mouth daily. (Patient not taking: Reported on 02/13/2018) 30 tablet 1  . ibuprofen (ADVIL,MOTRIN) 800 MG tablet Take 1 tablet (800 mg total) by mouth every 6 (six) hours as needed for moderate pain. (Patient not taking: Reported on 01/02/2018) 20 tablet 0  . medroxyPROGESTERone (PROVERA) 10 MG tablet Take 2 tablets (20 mg total) by mouth daily. 30 tablet 2  . promethazine (PHENERGAN) 25 MG tablet Take 1 tablet (25 mg total)  by mouth every 6 (six) hours as needed for nausea or vomiting. (Patient not taking: Reported on 01/02/2018) 30 tablet 0            Current Facility-Administered Medications  Medication Dose Route Frequency Provider Last Rate Last Dose  . leuprolide (LUPRON) injection 11.25 mg  11.25 mg Intramuscular Once Woodroe Mode, MD        Review of Systems Review of Systems  Constitutional: Negative.   Gastrointestinal: Negative.   Genitourinary: Positive for menstrual problem and vaginal bleeding (spotting). Negative for vaginal discharge.       Physical Exam Physical Exam Vitals signs and nursing note reviewed.  Constitutional:      Appearance: Normal appearance.  Cardiovascular:     Rate and Rhythm: Normal rate.  Pulmonary:     Effort: Pulmonary effort is normal.  Neurological:     Mental Status: She is alert.  Psychiatric:        Mood and Affect: Mood normal.        Behavior: Behavior normal.     Data Reviewed CBC Labs(Brief)          Component Value Date/Time   WBC 7.4 01/02/2018 0207   RBC 4.22 01/02/2018 0207   HGB 8.0 (L) 01/02/2018 0207   HGB 7.5 (  L) 11/08/2017 1112   HCT 27.6 (L) 01/02/2018 0207   HCT 26.5 (L) 11/08/2017 1112   PLT 418 (H) 01/02/2018 0207   PLT 451 (H) 11/08/2017 1112   MCV 65.4 (L) 01/02/2018 0207   MCV 65 (L) 11/08/2017 1112   MCH 19.0 (L) 01/02/2018 0207   MCHC 29.0 (L) 01/02/2018 0207   RDW 21.8 (H) 01/02/2018 0207   RDW 18.9 (H) 11/08/2017 1112       Assessment         Patient Active Problem List   Diagnosis Date Noted  . Nausea & vomiting 12/02/2017  . Fibroid uterus 11/08/2017  . Menometrorrhagia 11/08/2017  . Essential hypertension 11/08/2017   Anemia, taking iron    Plan    Once financial application is approved, schedule hysterectomy and perform endometrial biopsy. Provera 20 mg daily was reordered. Lupron injection when available. We discussed that the process has been frustrating  and I reassured her we are doing all we can for her   Woodroe Mode, MD 03/13/2018

## 2018-03-27 ENCOUNTER — Telehealth: Payer: Self-pay | Admitting: Obstetrics & Gynecology

## 2018-03-27 NOTE — Telephone Encounter (Signed)
Patient called about another appointment. She stated her financial application was denied, and wanted to know what the next step would be. Requesting a call back.

## 2018-04-01 NOTE — Telephone Encounter (Signed)
Called pt and pt informed me that she received two letters one stating one is approved and one is denied.  I explained to the pt that she should contact the number that is on the letter.  Once she finds out if she is approved or denied to please give the office a call so that she can be scheduled, Virtually or Telephone visit, with the provider to discuss options.  Pt stated understanding.

## 2018-04-08 ENCOUNTER — Encounter: Payer: Self-pay | Admitting: *Deleted

## 2018-12-27 ENCOUNTER — Other Ambulatory Visit: Payer: Self-pay

## 2018-12-27 ENCOUNTER — Encounter (HOSPITAL_COMMUNITY): Payer: Self-pay | Admitting: Emergency Medicine

## 2018-12-27 DIAGNOSIS — R1110 Vomiting, unspecified: Secondary | ICD-10-CM | POA: Insufficient documentation

## 2018-12-27 DIAGNOSIS — F17210 Nicotine dependence, cigarettes, uncomplicated: Secondary | ICD-10-CM | POA: Insufficient documentation

## 2018-12-27 DIAGNOSIS — Z79899 Other long term (current) drug therapy: Secondary | ICD-10-CM | POA: Insufficient documentation

## 2018-12-27 DIAGNOSIS — I10 Essential (primary) hypertension: Secondary | ICD-10-CM | POA: Insufficient documentation

## 2018-12-27 LAB — CBC
HCT: 29.2 % — ABNORMAL LOW (ref 36.0–46.0)
Hemoglobin: 8.7 g/dL — ABNORMAL LOW (ref 12.0–15.0)
MCH: 20 pg — ABNORMAL LOW (ref 26.0–34.0)
MCHC: 29.8 g/dL — ABNORMAL LOW (ref 30.0–36.0)
MCV: 67.3 fL — ABNORMAL LOW (ref 80.0–100.0)
Platelets: 399 10*3/uL (ref 150–400)
RBC: 4.34 MIL/uL (ref 3.87–5.11)
RDW: 20.7 % — ABNORMAL HIGH (ref 11.5–15.5)
WBC: 5.4 10*3/uL (ref 4.0–10.5)
nRBC: 0 % (ref 0.0–0.2)

## 2018-12-27 LAB — I-STAT BETA HCG BLOOD, ED (MC, WL, AP ONLY): I-stat hCG, quantitative: <5 m[IU]/mL (ref <5)

## 2018-12-27 LAB — URINALYSIS, ROUTINE W REFLEX MICROSCOPIC
Bilirubin Urine: NEGATIVE
Glucose, UA: NEGATIVE mg/dL
Hgb urine dipstick: NEGATIVE
Ketones, ur: 80 mg/dL — AB
Leukocytes,Ua: NEGATIVE
Nitrite: NEGATIVE
Protein, ur: 100 mg/dL — AB
Specific Gravity, Urine: 1.025 (ref 1.005–1.030)
pH: 9 — ABNORMAL HIGH (ref 5.0–8.0)

## 2018-12-27 MED ORDER — SODIUM CHLORIDE 0.9% FLUSH
3.0000 mL | Freq: Once | INTRAVENOUS | Status: AC
  Administered 2018-12-28: 3 mL via INTRAVENOUS

## 2018-12-27 NOTE — ED Triage Notes (Signed)
Patient complaining of abdominal and back pain. Patient states she feels nauseated and she has been throwing up. Patient states this has been going on for two days.

## 2018-12-28 ENCOUNTER — Emergency Department (HOSPITAL_COMMUNITY)
Admission: EM | Admit: 2018-12-28 | Discharge: 2018-12-28 | Disposition: A | Discharge status: Home / Self Care | Payer: Self-pay | Attending: Emergency Medicine | Admitting: Emergency Medicine

## 2018-12-28 ENCOUNTER — Emergency Department (HOSPITAL_COMMUNITY): Payer: Self-pay

## 2018-12-28 DIAGNOSIS — R1084 Generalized abdominal pain: Secondary | ICD-10-CM

## 2018-12-28 DIAGNOSIS — R1110 Vomiting, unspecified: Secondary | ICD-10-CM

## 2018-12-28 DIAGNOSIS — I10 Essential (primary) hypertension: Secondary | ICD-10-CM

## 2018-12-28 LAB — COMPREHENSIVE METABOLIC PANEL
ALT: 15 U/L (ref 0–44)
AST: 17 U/L (ref 15–41)
Albumin: 4.2 g/dL (ref 3.5–5.0)
Alkaline Phosphatase: 68 U/L (ref 38–126)
Anion gap: 14 (ref 5–15)
BUN: 8 mg/dL (ref 6–20)
CO2: 22 mmol/L (ref 22–32)
Calcium: 9.2 mg/dL (ref 8.9–10.3)
Chloride: 96 mmol/L — ABNORMAL LOW (ref 98–111)
Creatinine, Ser: 0.6 mg/dL (ref 0.44–1.00)
GFR calc Af Amer: >60 mL/min (ref >60)
GFR calc non Af Amer: >60 mL/min (ref >60)
Glucose, Bld: 107 mg/dL — ABNORMAL HIGH (ref 70–99)
Potassium: 3 mmol/L — ABNORMAL LOW (ref 3.5–5.1)
Sodium: 132 mmol/L — ABNORMAL LOW (ref 135–145)
Total Bilirubin: 1.2 mg/dL (ref 0.3–1.2)
Total Protein: 7.9 g/dL (ref 6.5–8.1)

## 2018-12-28 LAB — LIPASE, BLOOD: Lipase: 26 U/L (ref 11–51)

## 2018-12-28 MED ORDER — PROMETHAZINE HCL 25 MG RE SUPP: 25.0000 mg | Freq: Four times a day (QID) | RECTAL | 0 refills | Status: AC | PRN

## 2018-12-28 MED ORDER — KETOROLAC TROMETHAMINE 30 MG/ML IJ SOLN
30.0000 mg | Freq: Once | INTRAMUSCULAR | Status: AC
  Administered 2018-12-28: 30 mg via INTRAVENOUS
  Filled 2018-12-28: qty 1

## 2018-12-28 MED ORDER — FAMOTIDINE IN NACL 20-0.9 MG/50ML-% IV SOLN
20.0000 mg | Freq: Once | INTRAVENOUS | Status: AC
  Administered 2018-12-28: 20 mg via INTRAVENOUS
  Filled 2018-12-28: qty 50

## 2018-12-28 MED ORDER — ONDANSETRON HCL 4 MG/2ML IJ SOLN
4.0000 mg | Freq: Once | INTRAMUSCULAR | Status: AC
  Administered 2018-12-28: 4 mg via INTRAVENOUS
  Filled 2018-12-28: qty 2

## 2018-12-28 MED ORDER — POTASSIUM CHLORIDE CRYS ER 20 MEQ PO TBCR
40.0000 meq | EXTENDED_RELEASE_TABLET | Freq: Once | ORAL | Status: AC
  Administered 2018-12-28: 40 meq via ORAL
  Filled 2018-12-28: qty 2

## 2018-12-28 MED ORDER — HYDROCODONE-ACETAMINOPHEN 5-325 MG PO TABS
1.0000 | ORAL_TABLET | Freq: Once | ORAL | Status: AC | PRN
  Administered 2018-12-28: 1 via ORAL
  Filled 2018-12-28: qty 1

## 2018-12-28 MED ORDER — PROMETHAZINE HCL 25 MG/ML IJ SOLN
12.5000 mg | INTRAMUSCULAR | Status: AC
  Administered 2018-12-28: 12.5 mg via INTRAVENOUS
  Filled 2018-12-28: qty 1

## 2018-12-28 MED ORDER — SODIUM CHLORIDE 0.9 % IV BOLUS
2000.0000 mL | Freq: Once | INTRAVENOUS | Status: AC
  Administered 2018-12-28: 2000 mL via INTRAVENOUS

## 2018-12-28 MED ORDER — POTASSIUM CHLORIDE 10 MEQ/100ML IV SOLN
10.0000 meq | Freq: Once | INTRAVENOUS | Status: AC
  Administered 2018-12-28: 10 meq via INTRAVENOUS
  Filled 2018-12-28: qty 100

## 2018-12-28 NOTE — ED Provider Notes (Signed)
[facility] DEPT Provider Note   CSN: [id number] Arrival date & time: 12/27/18  2147     History Chief Complaint  Patient presents with  . Abdominal Pain    Claire Phillips is a 43 y.o. female.   43 year old female with a history of fibroids and hypertension presents to the emergency department complaining of abdominal pain.  She began experiencing abdominal pain yesterday with associated back pain.  This has remained constant.  Patient complaining of associated nausea with too numerous to count episodes of emesis.  She has a history of constipation.  Denies sick contacts, hematemesis, hematuria, melena, hematochezia, diarrhea.  Was seen at OSH x 4 in November for abdominal pain and difficulty tolerating PO; presumed associated with her multiple large uterine fibroids based on work ups.  No hx of abdominal surgeries.  The history is provided by the patient. No language interpreter was used.  Abdominal Pain      Past Medical History:  Diagnosis Date  . Fibroid   . Hypertension     Patient Active Problem List   Diagnosis Date Noted  . Nausea & vomiting 12/02/2017  . Fibroid uterus 11/08/2017  . Menometrorrhagia 11/08/2017  . Essential hypertension 11/08/2017    Past Surgical History:  Procedure Laterality Date  . NO PAST SURGERIES       OB History    Gravida  1   Para  1   Term  1   Preterm      AB      Living  1     SAB      TAB      Ectopic      Multiple      Live Births  1           History reviewed. No pertinent family history.  Social History   Tobacco Use  . Smoking status: Current Every Day Smoker    Packs/day: 0.50    Types: Cigarettes  . Smokeless tobacco: Never Used  Substance Use Topics  . Alcohol use: Yes    Alcohol/week: 5.0 standard drinks    Types: 3 Glasses of wine, 2 Cans of beer per week    Comment: Occasionally  . Drug use: Never    Home Medications Prior to Admission  medications   Medication Sig Start Date End Date Taking? Authorizing Provider  acetaminophen (TYLENOL) 500 MG tablet Take 1,000 mg by mouth every 6 (six) hours as needed for moderate pain.   Yes [provider]  ibuprofen (ADVIL) 200 MG tablet Take 800 mg by mouth every 6 (six) hours as needed for moderate pain.   Yes [provider]  polyethylene glycol (MIRALAX) 17 g packet Take 17 g by mouth daily as needed for moderate constipation.   Yes [provider]  cephALEXin (KEFLEX) 500 MG capsule Take 1 capsule (500 mg total) by mouth 2 (two) times daily. Patient not taking: Reported on 01/02/2018 01/02/18   Orpah Greek, MD  ferrous sulfate 325 (65 FE) MG tablet Take 1 tablet (325 mg total) by mouth 2 (two) times daily with a meal. Patient not taking: Reported on 12/28/2018 11/12/17   Woodroe Mode, MD  hydrochlorothiazide (HYDRODIURIL) 25 MG tablet Take 1 tablet (25 mg total) by mouth daily. Patient not taking: Reported on 02/13/2018 12/02/17   Woodroe Mode, MD  ibuprofen (ADVIL,MOTRIN) 800 MG tablet Take 1 tablet (800 mg total) by mouth every 6 (six) hours as needed for moderate  pain. Patient not taking: Reported on 01/02/2018 01/02/18   Orpah Greek, MD  medroxyPROGESTERone (PROVERA) 10 MG tablet Take 2 tablets (20 mg total) by mouth daily. Patient not taking: Reported on 12/28/2018 02/13/18   Woodroe Mode, MD  promethazine (PHENERGAN) 25 MG suppository Place 1 suppository (25 mg total) rectally every 6 (six) hours as needed for nausea or vomiting. 12/28/18   Antonietta Breach, PA-C    Allergies    Aspirin  Review of Systems   Review of Systems  Gastrointestinal: Positive for abdominal pain.  Ten systems reviewed and are negative for acute change, except as noted in the HPI.    Physical Exam Updated Vital Signs BP (!) 165/105   Pulse 92   Temp 98.6 F (37 C) (Oral)   Resp 18   Ht 5\' 6"  (1.676 m)   Wt 59 kg   LMP 12/16/2018   SpO2 100%    BMI 20.98 kg/m   Physical Exam Vitals and nursing note reviewed.  Constitutional:      General: She is not in acute distress.    Appearance: She is well-developed. She is not diaphoretic.     Comments: Nontoxic appearing, slightly agitated. In NAD.  HENT:     Head: Normocephalic and atraumatic.  Eyes:     General: No scleral icterus.    Conjunctiva/sclera: Conjunctivae normal.  Cardiovascular:     Rate and Rhythm: Normal rate and regular rhythm.     Pulses: Normal pulses.  Pulmonary:     Effort: Pulmonary effort is normal. No respiratory distress.     Comments: Respirations even and unlabored Abdominal:     Palpations: Abdomen is soft.     Comments: Mild abdominal distension. Abdomen is soft. No peritoneal signs.  Musculoskeletal:        General: Normal range of motion.     Cervical back: Normal range of motion.  Skin:    General: Skin is warm and dry.     Coloration: Skin is not pale.     Findings: No erythema or rash.  Neurological:     Mental Status: She is alert and oriented to person, place, and time.     Coordination: Coordination normal.  Psychiatric:        Behavior: Behavior normal.     ED Results / Procedures / Treatments   Labs (all labs ordered are listed, but only abnormal results are displayed) Labs Reviewed  COMPREHENSIVE METABOLIC PANEL - Abnormal; Notable for the following components:      Result Value   Sodium 132 (*)    Potassium 3.0 (*)    Chloride 96 (*)    Glucose, Bld 107 (*)    All other components within normal limits  CBC - Abnormal; Notable for the following components:   Hemoglobin 8.7 (*)    HCT 29.2 (*)    MCV 67.3 (*)    MCH 20.0 (*)    MCHC 29.8 (*)    RDW 20.7 (*)    All other components within normal limits  URINALYSIS, ROUTINE W REFLEX MICROSCOPIC - Abnormal; Notable for the following components:   APPearance HAZY (*)    pH 9.0 (*)    Ketones, ur 80 (*)    Protein, ur 100 (*)    Bacteria, UA RARE (*)    All other  components within normal limits  LIPASE, BLOOD  I-STAT BETA HCG BLOOD, ED ([facility], [facility], [facility] ONLY)    EKG None  Radiology DG Abd 2 Views  Result Date: 12/28/2018 CLINICAL DATA:  Patient complaining of abdominal and back pain, nausea, emesis for 2 days EXAM: ABDOMEN - 2 VIEW COMPARISON:  CT abdomen pelvis 11/19/2018, abdominal radiograph 12/31/2017 FINDINGS: The bowel gas pattern is normal. There is no evidence of free air. No radio-opaque calculi over the gallbladder fossa or urinary tract. Few phleboliths in the pelvis. No other significant radiographic abnormality is seen. IMPRESSION: Normal bowel gas pattern. No evidence of free air or bowel obstruction. Electronically Signed   By: Lovena Le M.D.   On: 12/28/2018 03:07    Procedures Procedures (including critical care time)  Medications Ordered in ED Medications  sodium chloride flush (NS) 0.9 % injection 3 mL (3 mLs Intravenous Given 12/28/18 0317)  sodium chloride 0.9 % bolus 2,000 mL (0 mLs Intravenous Stopped 12/28/18 0516)  potassium chloride 10 mEq in 100 mL IVPB (0 mEq Intravenous Stopped 12/28/18 0423)  famotidine (PEPCID) IVPB 20 mg premix (0 mg Intravenous Stopped 12/28/18 0459)  ketorolac (TORADOL) 30 MG/ML injection 30 mg (30 mg Intravenous Given 12/28/18 0315)  ondansetron (ZOFRAN) injection 4 mg (4 mg Intravenous Given 12/28/18 0315)  HYDROcodone-acetaminophen (NORCO/VICODIN) 5-325 MG per tablet 1 tablet (1 tablet Oral Given 12/28/18 0412)  potassium chloride SA (KLOR-CON) CR tablet 40 mEq (40 mEq Oral Given 12/28/18 0514)  promethazine (PHENERGAN) injection 12.5 mg (12.5 mg Intravenous Given 12/28/18 0515)    ED Course  I have reviewed the triage vital signs and the nursing notes.  Pertinent labs & imaging results that were available during my care of the patient were reviewed by me and considered in my medical decision making (see chart for details).    MDM Rules/Calculators/A&P                       58:67  AM 43 year old female presents to the emergency department for generalized abdominal pain with persistent nausea and vomiting, difficulty tolerating PO.  Chart reviewed and she was seen at OSH x4 November for similar complaints.  During these evaluations, patient underwent pelvic ultrasound x2, abdominal CT scan, abdominal x-ray.  Findings significant only for large fibroid uterus.  It does appear that she followed up with OB/GYN on 11/28/2018.  Surgery was recommended.  Work-up today with stable anemia.  No leukocytosis.  She has mild hypokalemia, but preserved liver and kidney function.  No evidence of UTI, though UA suggests dehydration.  Will hydrate with IV fluids and treat symptomatically.  Presently do not see indication for further emergent imaging.  Will monitor.  3:50 AM Norco ordered for pain as patient reporting that Toradol "doesn't work" for her.   5:00 AM Patient tolerating PO. Reports some hiccups, but without frank nausea. States that she often vomits when her hiccups are present. Will given phenergan. Discussed results of Xray which are reassuring and without concern for obstruction, perforation. With stable labs and chronicity of symptoms for 2+ months, feel patient can follow up further with her OBGYN as an outpatient.  5:54 AM IV potassium and Pepcid finished along with IVF. Will discharge. Return precautions provided. Patient discharged in stable condition with no unaddressed concerns.   Final Clinical Impression(s) / ED Diagnoses Final diagnoses:  Vomiting  Generalized abdominal pain  Hypertension not at goal    Rx / DC Orders ED Discharge Orders         Ordered    promethazine (PHENERGAN) 25 MG suppository  Every 6 hours PRN     12/28/18 0539  Antonietta Breach, PA-C [id number]    Delora Fuel, MD [id number] [phone]

## 2018-12-28 NOTE — Discharge Instructions (Addendum)
Use Phenergan suppositories if unable to tolerate medications by mouth due to persistent nausea or vomiting.  We recommend follow-up with your OB/GYN to discuss plans for continued pain management until you are able to have your surgery scheduled and completed.  Follow-up with your primary care doctor in the interim.  Return to the ED for new or concerning symptoms.

## 2019-01-05 ENCOUNTER — Ambulatory Visit: Payer: Self-pay | Admitting: Obstetrics & Gynecology

## 2019-01-05 ENCOUNTER — Encounter: Payer: Self-pay | Admitting: Obstetrics & Gynecology

## 2020-05-01 IMAGING — US US PELVIS COMPLETE TRANSABD/TRANSVAG
1 series · 15 of 25 positions shown · non-contrast
Comparison: CT of the abdomen and pelvis on 11/12/2015

CLINICAL DATA: Fibroids.

EXAM:
TRANSABDOMINAL AND TRANSVAGINAL ULTRASOUND OF PELVIS
TECHNIQUE: Both transabdominal and transvaginal ultrasound examinations of the
pelvis were performed. Transabdominal technique was performed for
global imaging of the pelvis including uterus, ovaries, adnexal
regions, and pelvic cul-de-sac. It was necessary to proceed with
endovaginal exam following the transabdominal exam to visualize the
endometrium, uterus, ovaries..

[Series 1: us pelvis complete transabd/transvag · 57 acquisitions, 15 frames shown]
[im 1/57]
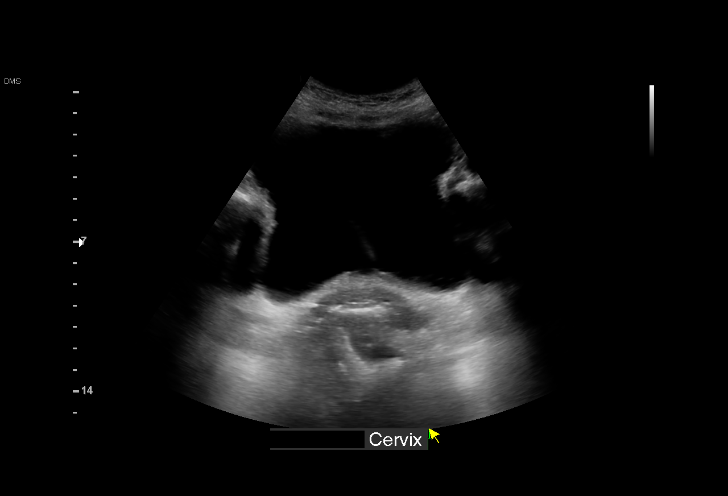
[im 5/57]
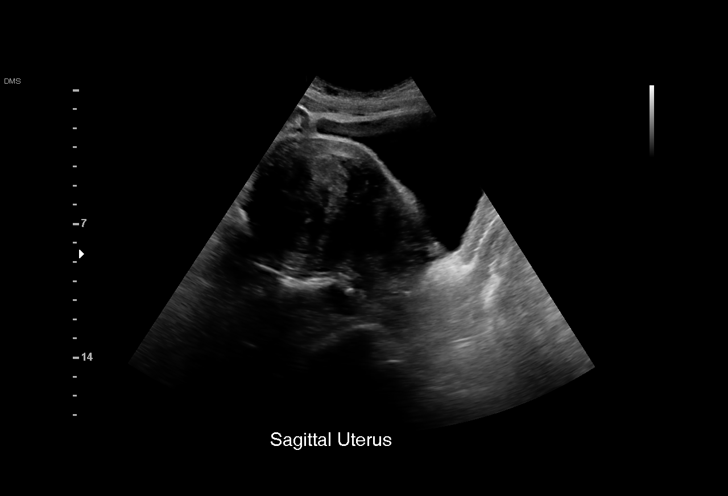
[im 10/57]
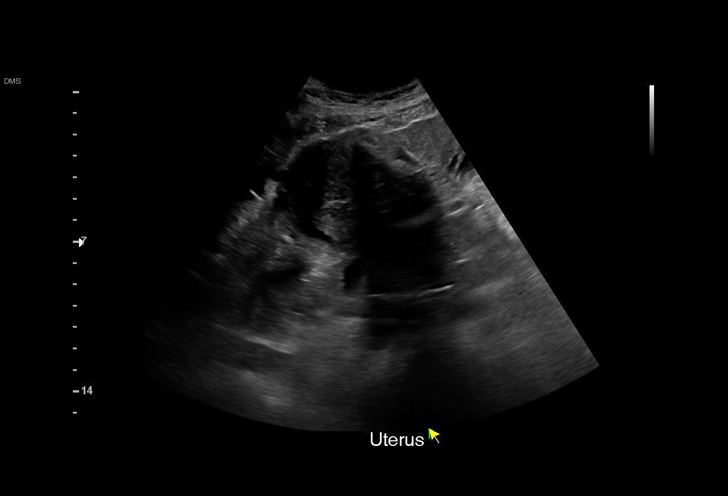
[im 12/57]
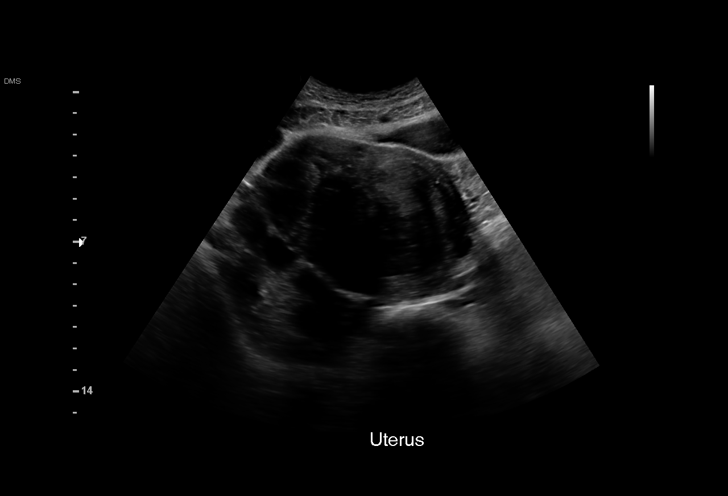
[im 17/57]
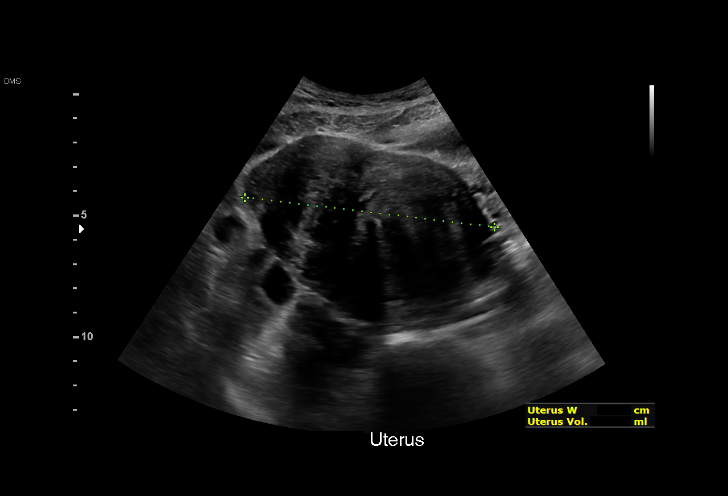
[im 22/57]
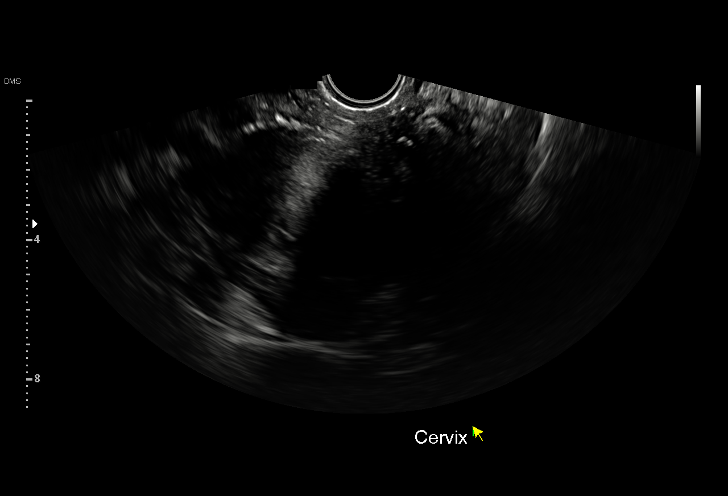
[im 24/57]
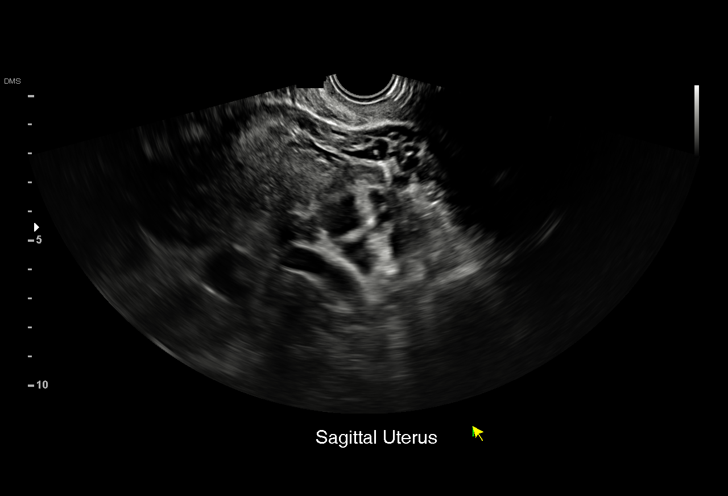
[im 29/57]
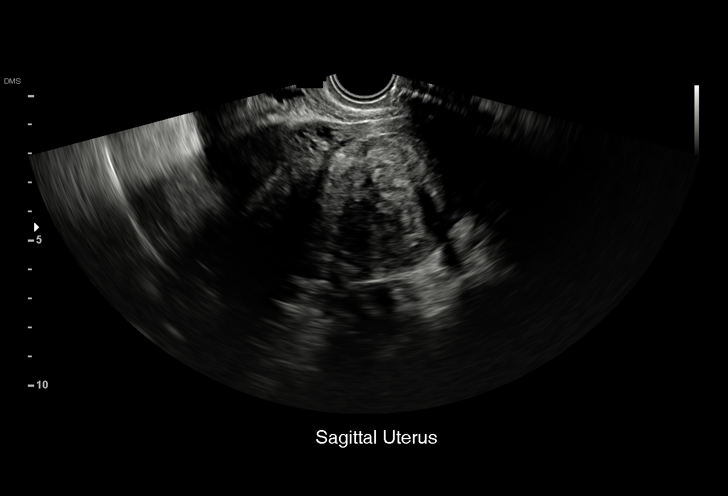
[im 33/57]
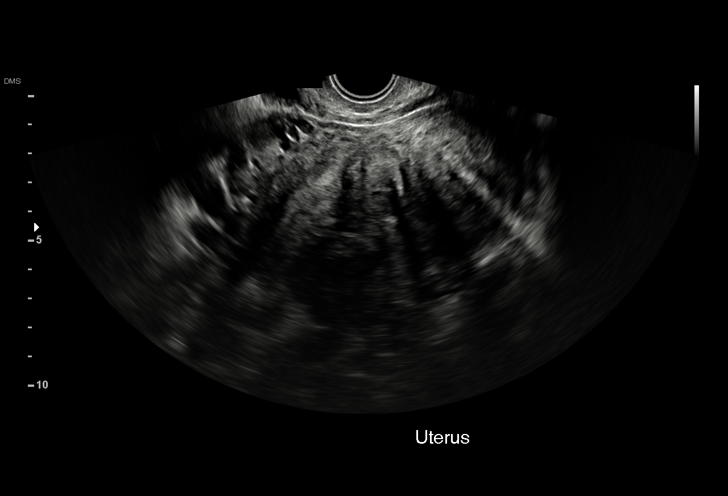
[im 36/57]
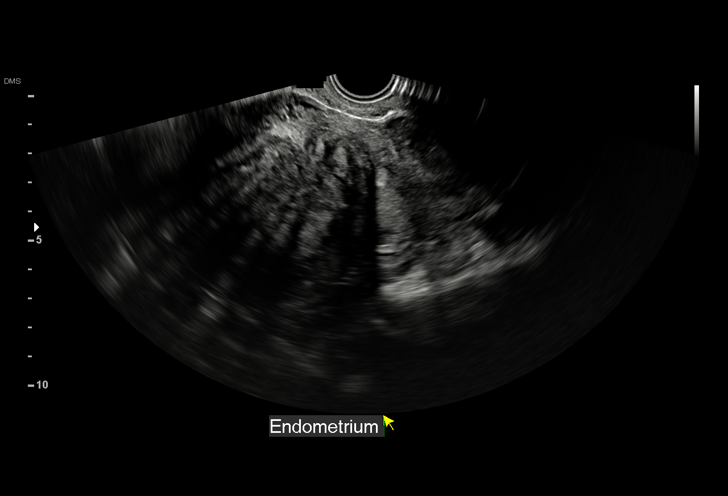
[im 40/57]
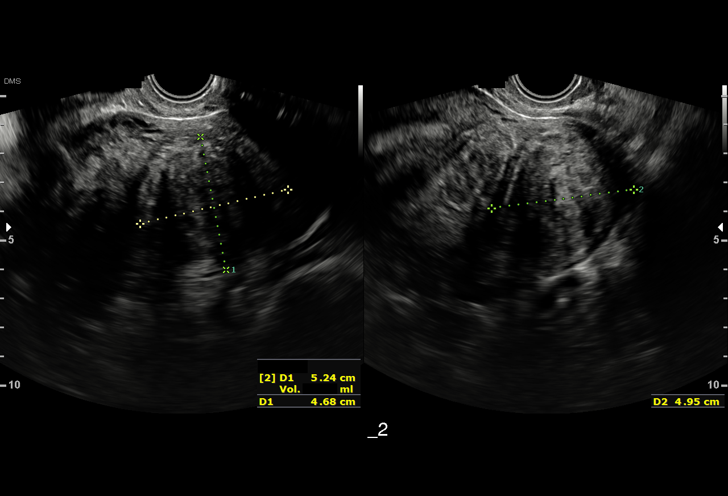
[im 45/57]
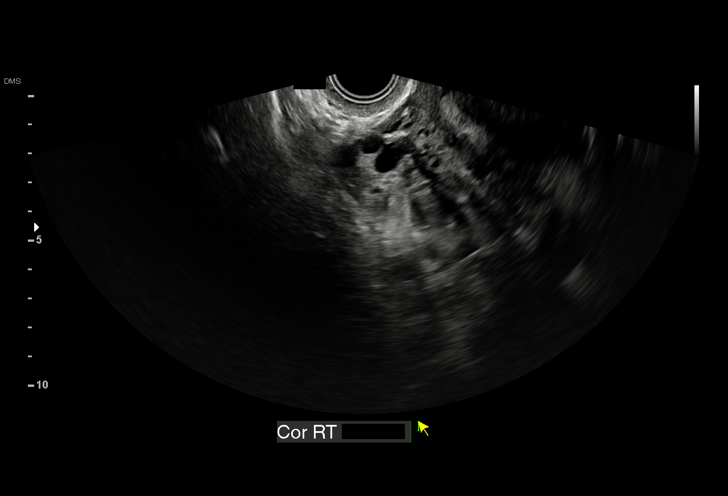
[im 47/57]
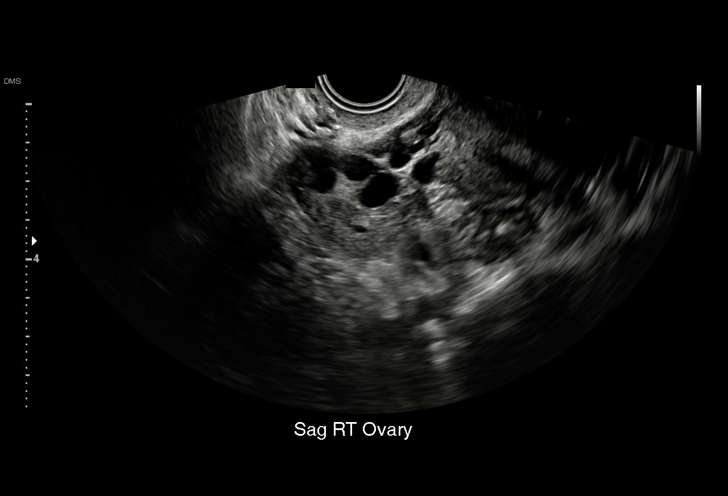
[im 52/57]
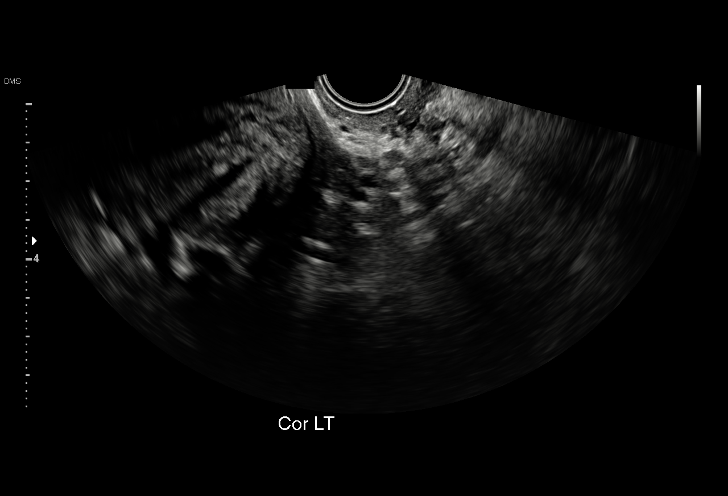
[im 57/57]
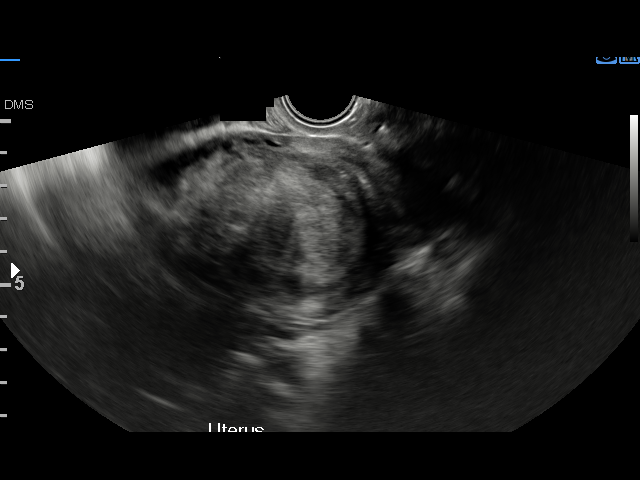

[15 of 25 positions shown; findings below may reference images not displayed]

FINDINGS: Uterus

Measurements: At least 12.9 x 8.1 x 10.3 centimeter = volume:
mL. No fibroids or other mass visualized. Multiple fibroids are
present, obscuring portions of the uterus/endometrium. Largest
fibroids measure 6.3, 5.2, and 5.2 centimeters.

Endometrium

Thickness: 9.5 millimeters, displaced by multiple fibroids.. No
focal abnormality visualized.

Right ovary

Measurements: 3.4 x 2.6 x 2.2 centimeters = volume: 10.0 mL. Normal
appearance/no adnexal mass.

Left ovary

Measurements: 2.5 x 1.8 x 2.0 centimeters = volume: 4.5 mL. Normal
appearance/no adnexal mass.

Other findings

No abnormal free fluid.
IMPRESSION: 1. Multiple fibroids enlarging the uterus.
2. Normal appearance of the endometrial stripe.
3. No adnexal mass.

## 2020-05-29 IMAGING — CT CT ABD-PELV W/ CM
2 of 5 series · 15 of 46 positions shown, 17 images · IV contrast (Omni 300)
Comparison: CT abdomen pelvis 11/12/2015

CLINICAL DATA: Abdominal pain, nausea and vomiting.

EXAM:
CT ABDOMEN AND PELVIS WITH CONTRAST
TECHNIQUE: Multidetector CT imaging of the abdomen and pelvis was performed
using the standard protocol following bolus administration of
intravenous contrast.
CONTRAST:  100mL OMNIPAQUE IOHEXOL 300 MG/ML  SOLN

[Series 3: a/p w/ 5mm · axial · 0.58mm/px · z∈[+766,+1166]mm · 12 of 89 slices shown, 14 images]
[im 5/89  soft-tissue]
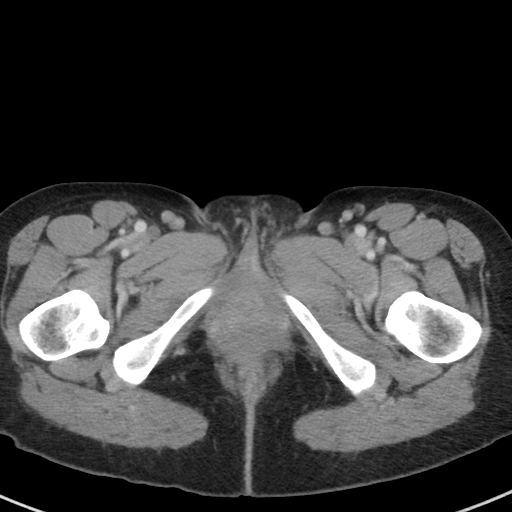
[im 5/89  bone]
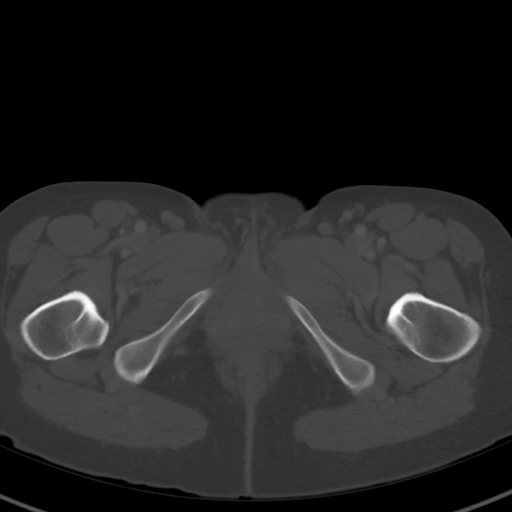
[im 13/89  soft-tissue]
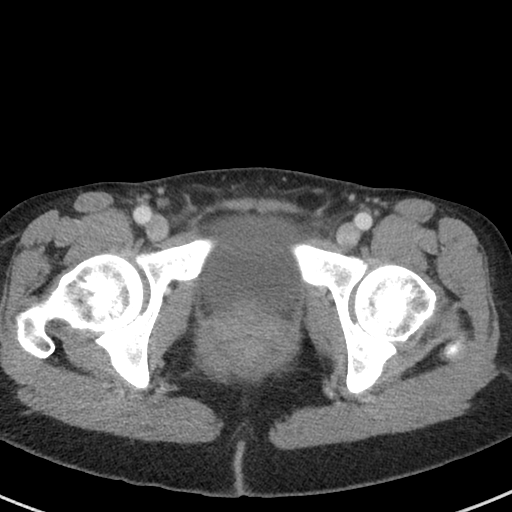
[im 21/89  soft-tissue]
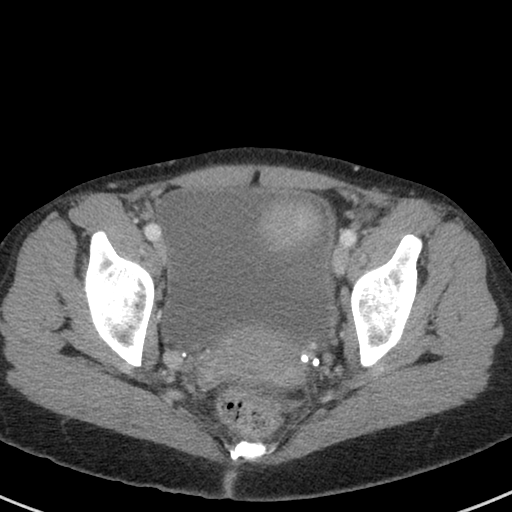
[im 29/89  soft-tissue]
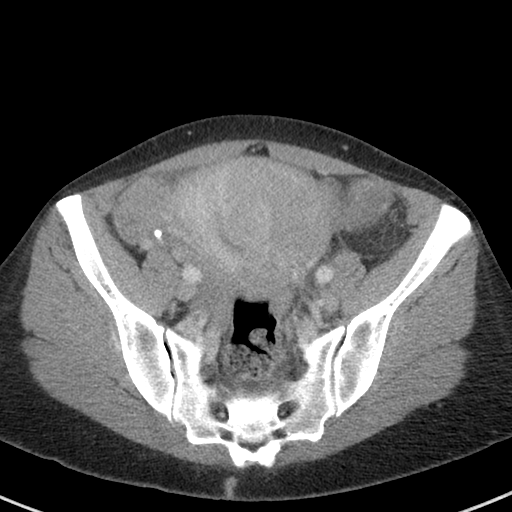
[im 33/89  soft-tissue]
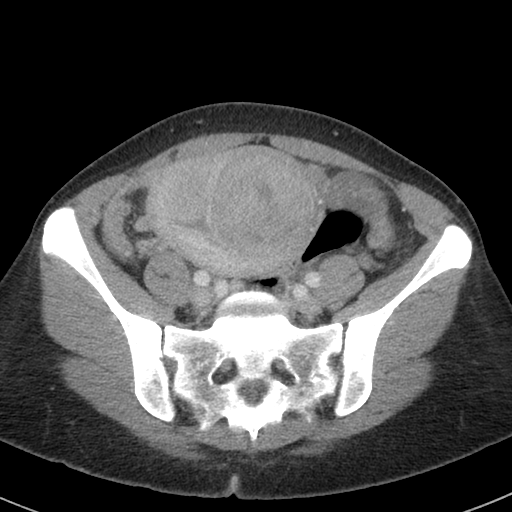
[im 41/89  soft-tissue]
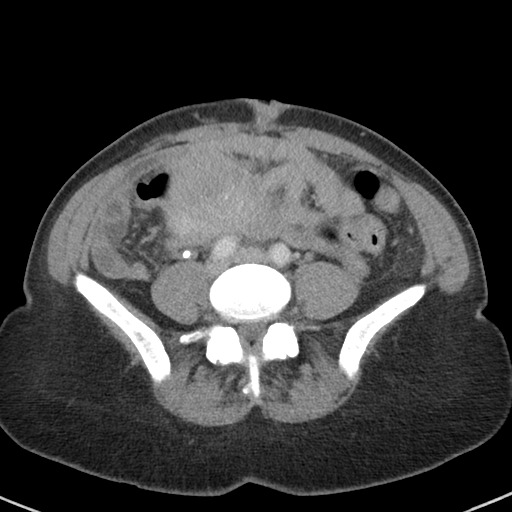
[im 49/89  soft-tissue]
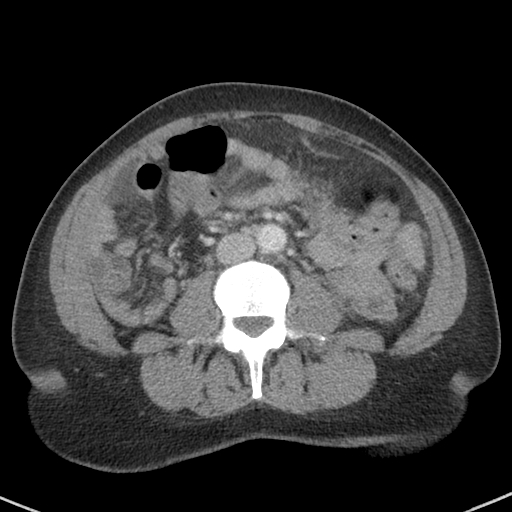
[im 57/89  soft-tissue]
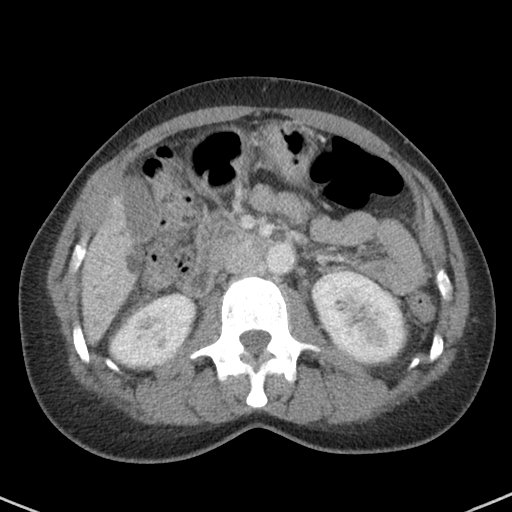
[im 61/89  soft-tissue]
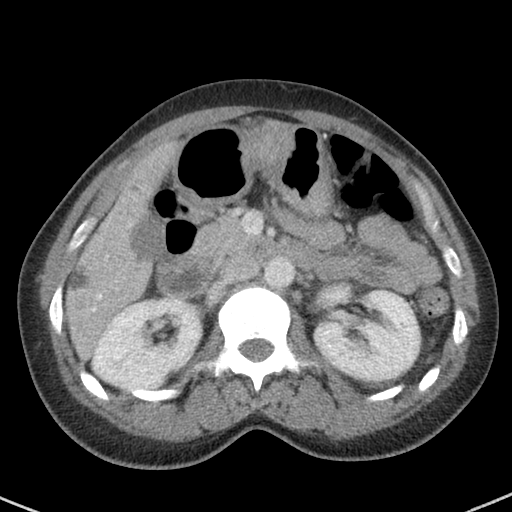
[im 61/89  bone]
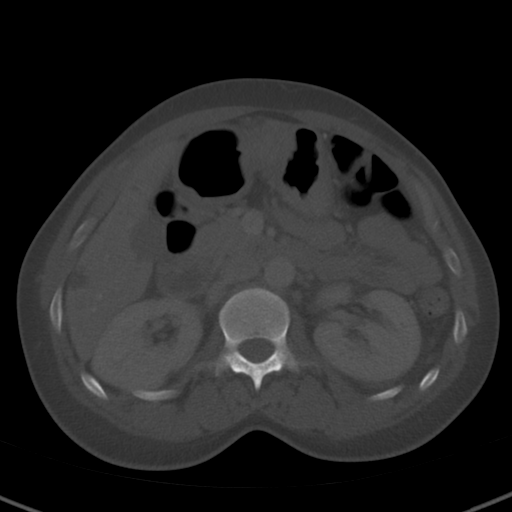
[im 69/89  soft-tissue]
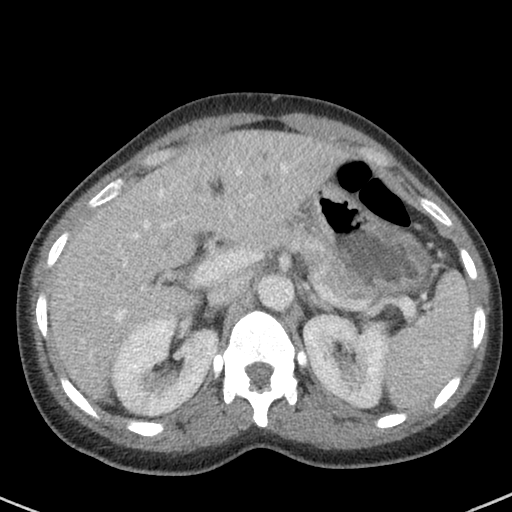
[im 77/89  soft-tissue]
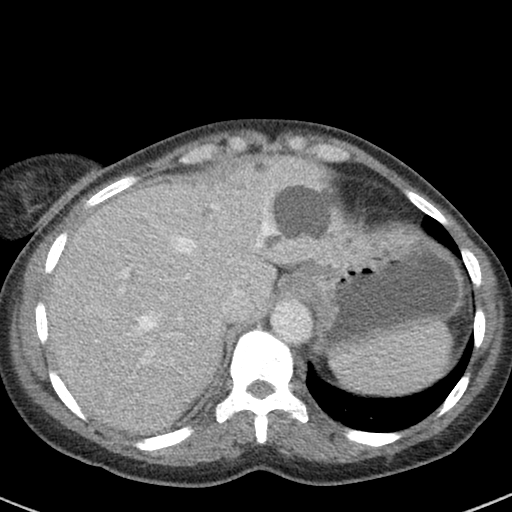
[im 85/89  soft-tissue]
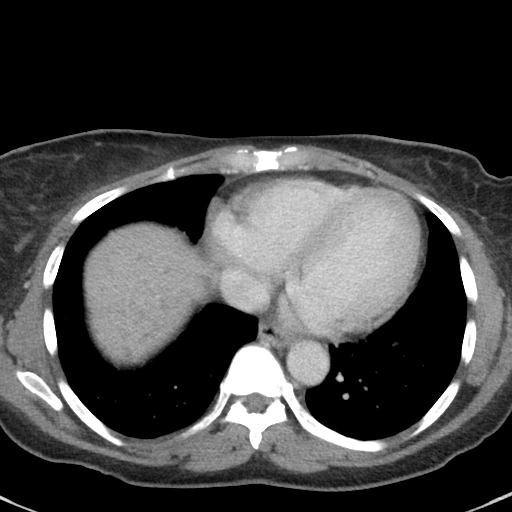

[Series 6: a/p w/ cor · coronal · 0.57mm/px · 3 of 122 slices shown]
[im 41/122  soft-tissue]
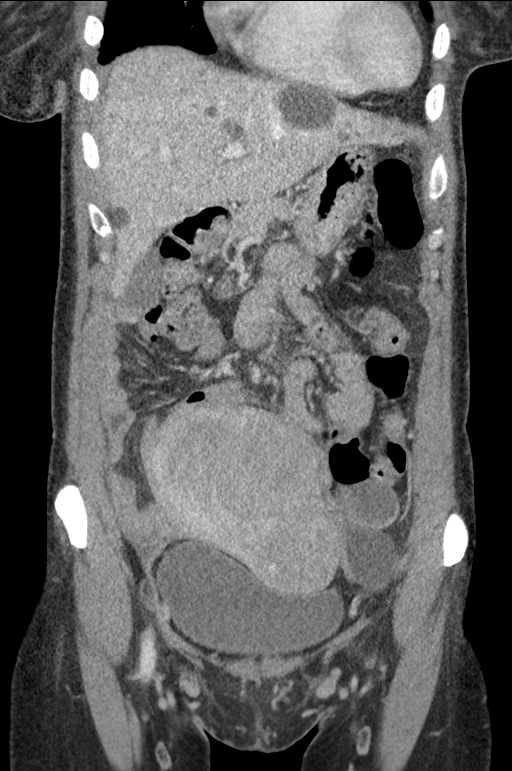
[im 54/122  soft-tissue]
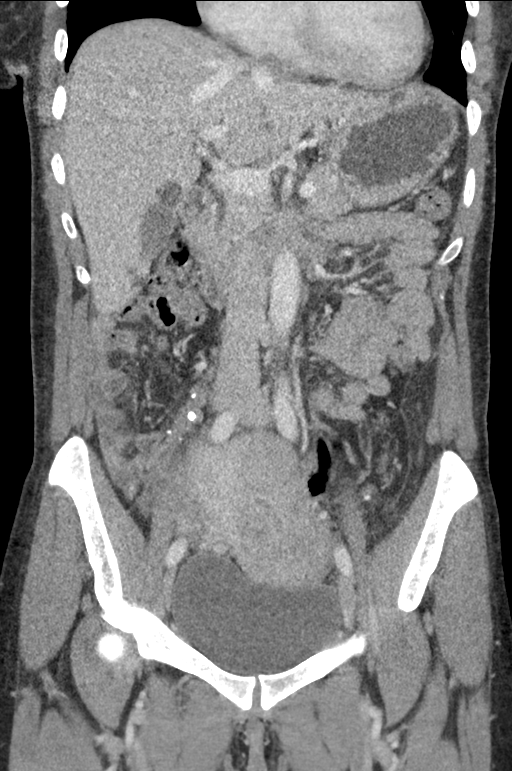
[im 68/122  soft-tissue]
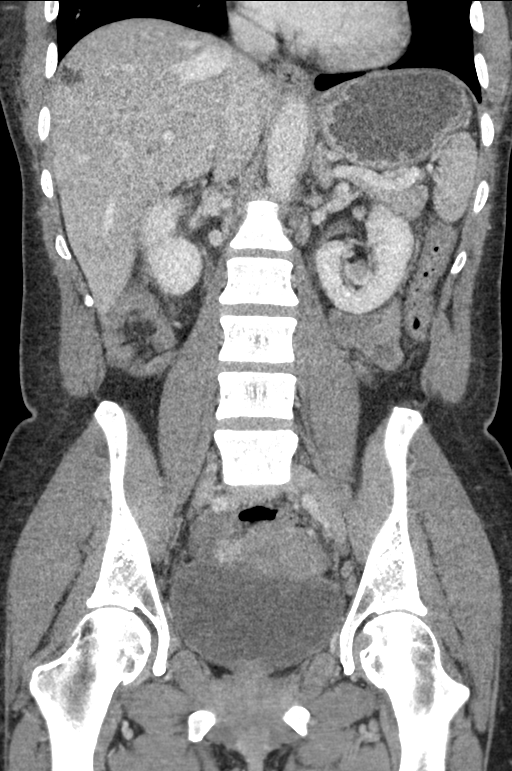

[15 of 46 positions shown; findings below may reference images not displayed]

FINDINGS: LOWER CHEST: There is no basilar pleural or apical pericardial
effusion.

HEPATOBILIARY: Numerous hepatic cysts, the largest of which is
located in the left hepatic lobe and measures 3.4 cm. The
gallbladder is normal.

PANCREAS: The pancreatic parenchymal contours are normal and there
is no ductal dilatation. There is no peripancreatic fluid
collection.

SPLEEN: Normal.

ADRENALS/URINARY TRACT:

--Adrenal glands: Normal.

--Kidneys: There are areas of hypoattenuation at the lower poles of
both kidneys. No hydronephrosis.

--Urinary bladder: Normal for degree of distention

STOMACH/BOWEL:

--Stomach/Duodenum: There is no hiatal hernia or other gastric
abnormality. The duodenal course and caliber are normal.

--Small bowel: No dilatation or inflammation.

--Colon: No focal abnormality.

--Appendix: Normal.

VASCULAR/LYMPHATIC: Normal course and caliber of the major abdominal
vessels. No abdominal or pelvic lymphadenopathy.

REPRODUCTIVE: Unchanged appearance of fibroid uterus. Left ovarian
cysts measure up to 2.8 cm. Unchanged appearance of right ovary.

MUSCULOSKELETAL. No bony spinal canal stenosis or focal osseous
abnormality.

OTHER: None.
IMPRESSION: 1. Areas of hypoattenuation at the lower poles of both kidneys could
indicate pyelonephritis. Correlate with urinalysis results.
2. Unchanged appearance of fibroid uterus and left ovarian cysts.

## 2021-05-24 IMAGING — CR DG ABDOMEN 2V
2 series · 2 of 2 positions shown · non-contrast
Comparison: CT abdomen pelvis 11/19/2018, abdominal radiograph
12/31/2017

CLINICAL DATA: Patient complaining of abdominal and back pain,
nausea, emesis for 2 days

EXAM:
ABDOMEN - 2 VIEW

[w abdomen upright]
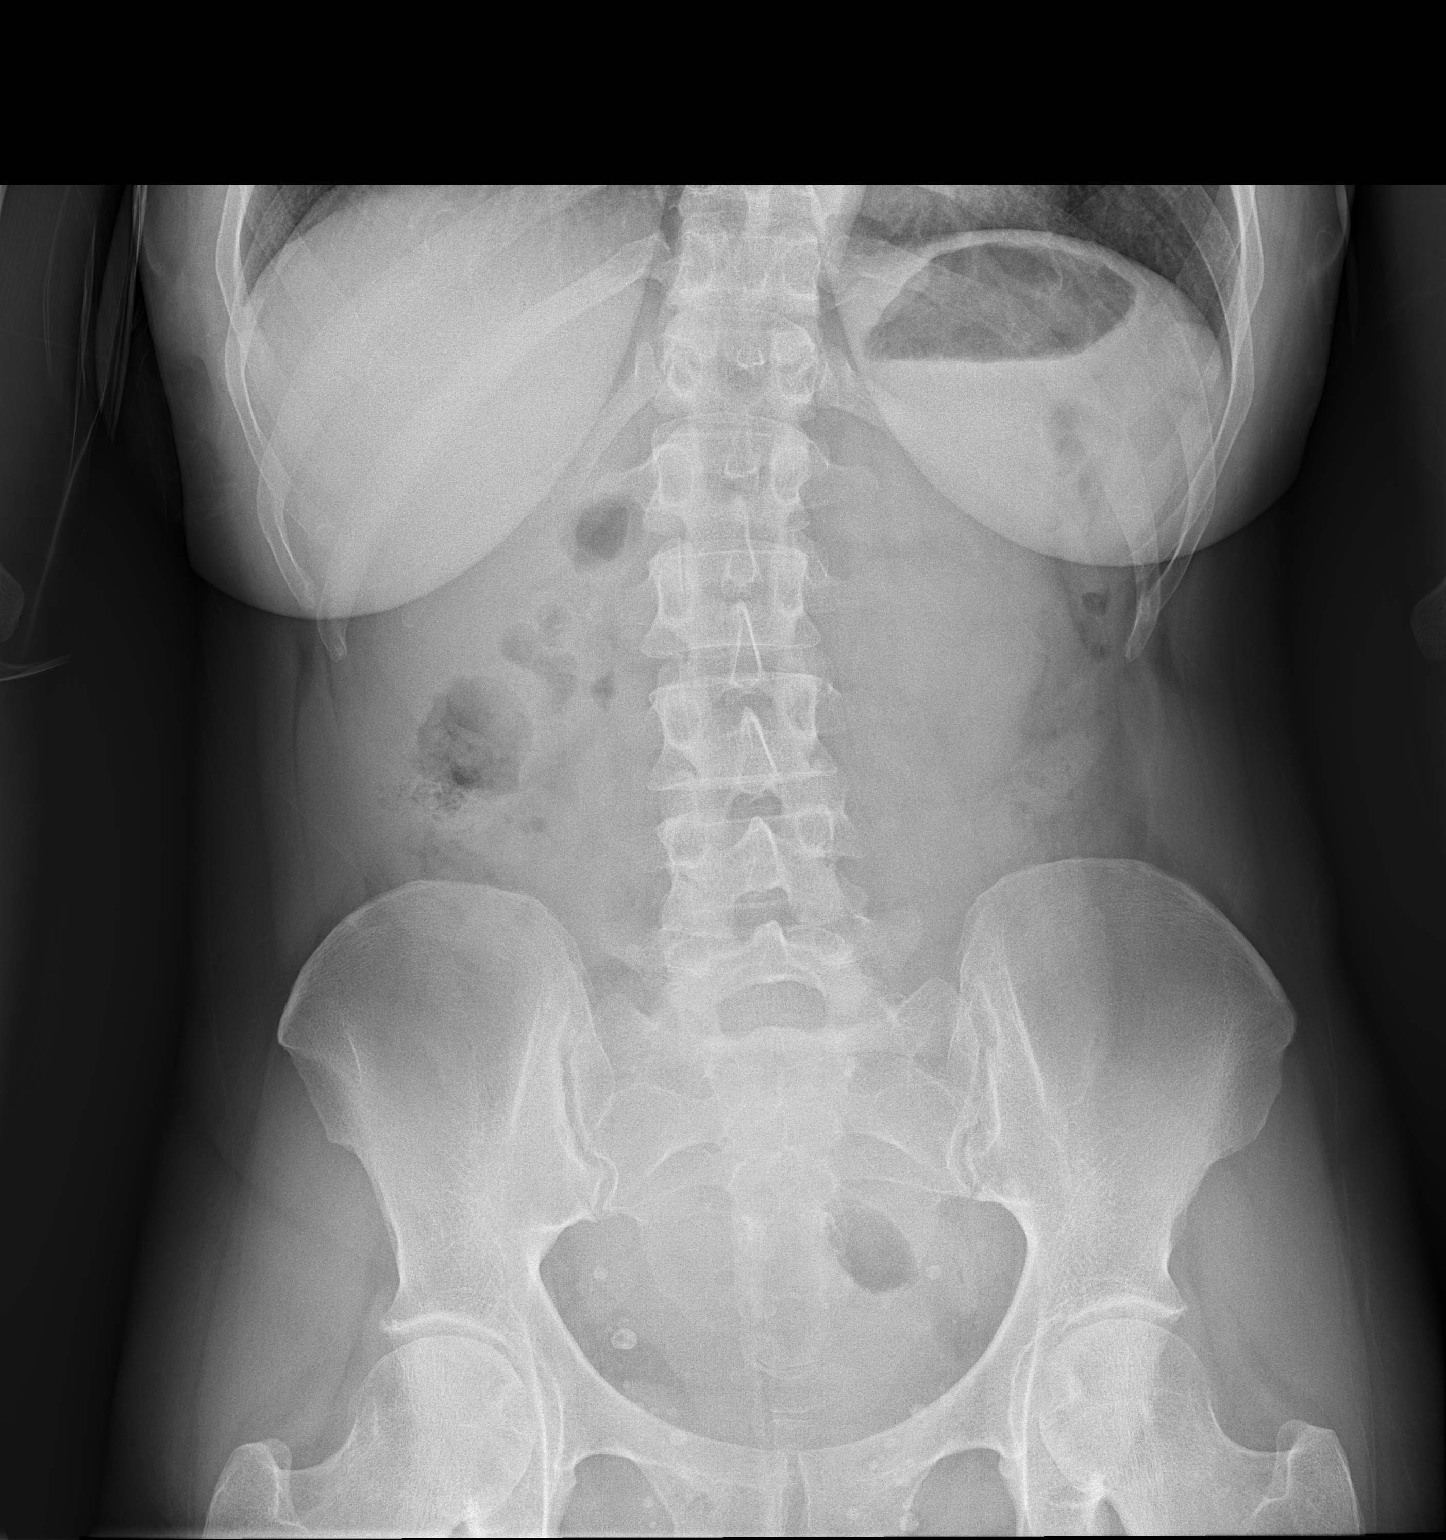

[t abdomen supine]
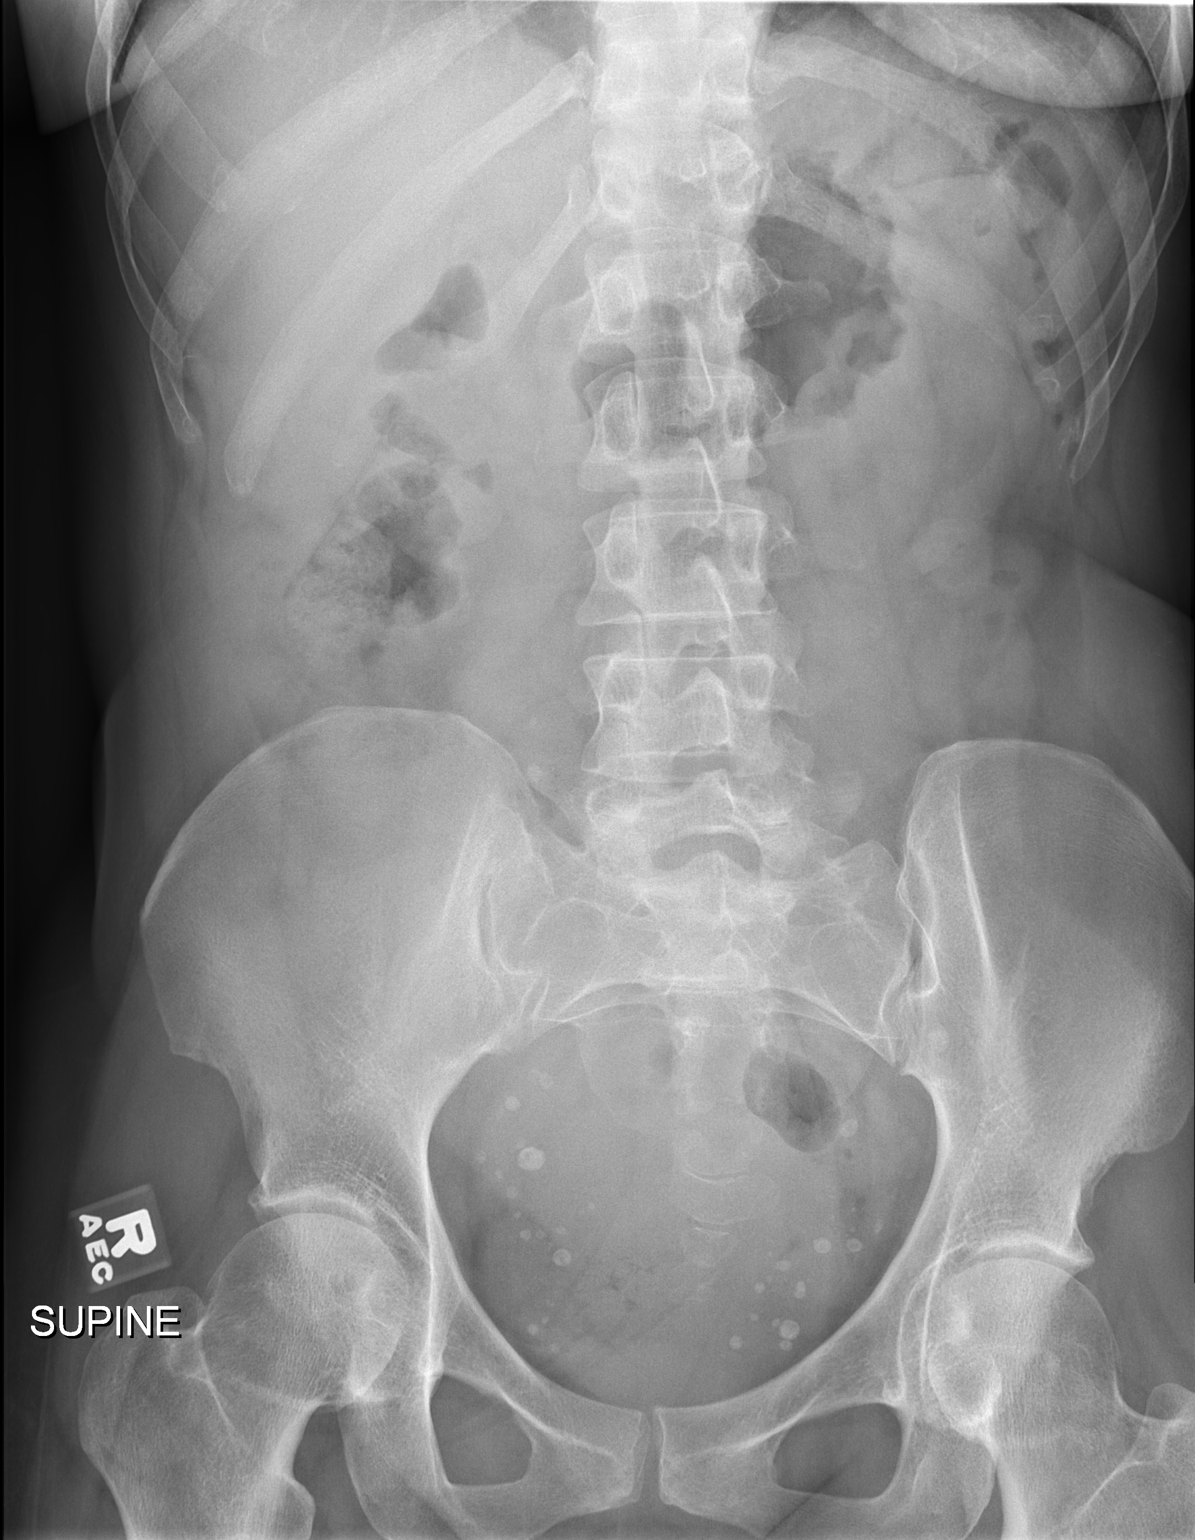

[2 of 2 positions shown; findings below may reference images not displayed]

FINDINGS: The bowel gas pattern is normal. There is no evidence of free air.
No radio-opaque calculi over the gallbladder fossa or urinary tract.
Few phleboliths in the pelvis. No other significant radiographic
abnormality is seen.
IMPRESSION: Normal bowel gas pattern. No evidence of free air or bowel
obstruction.
